# Patient Record
Sex: Male | Born: 1950 | Race: White | Hispanic: No | Marital: Married | Smoking: Never smoker
Health system: Southern US, Community
[De-identification: ages and names within clinical notes are randomized; demographics above are authoritative.]

## PROBLEM LIST (undated history)

## (undated) DIAGNOSIS — T8859XA Other complications of anesthesia, initial encounter: Secondary | ICD-10-CM

## (undated) DIAGNOSIS — I2510 Atherosclerotic heart disease of native coronary artery without angina pectoris: Secondary | ICD-10-CM

## (undated) DIAGNOSIS — T4145XA Adverse effect of unspecified anesthetic, initial encounter: Secondary | ICD-10-CM

## (undated) DIAGNOSIS — C801 Malignant (primary) neoplasm, unspecified: Secondary | ICD-10-CM

## (undated) DIAGNOSIS — M1990 Unspecified osteoarthritis, unspecified site: Secondary | ICD-10-CM

## (undated) DIAGNOSIS — I499 Cardiac arrhythmia, unspecified: Secondary | ICD-10-CM

## (undated) DIAGNOSIS — I10 Essential (primary) hypertension: Secondary | ICD-10-CM

## (undated) DIAGNOSIS — Z9889 Other specified postprocedural states: Secondary | ICD-10-CM

## (undated) DIAGNOSIS — R112 Nausea with vomiting, unspecified: Secondary | ICD-10-CM

## (undated) DIAGNOSIS — M1712 Unilateral primary osteoarthritis, left knee: Secondary | ICD-10-CM

## (undated) DIAGNOSIS — G4730 Sleep apnea, unspecified: Secondary | ICD-10-CM

## (undated) HISTORY — PX: CARDIAC ELECTROPHYSIOLOGY MAPPING AND ABLATION: SHX1292

## (undated) HISTORY — PX: BACK SURGERY: SHX140

## (undated) HISTORY — PX: ANTERIOR CERVICAL DECOMP/DISCECTOMY FUSION: SHX1161

## (undated) HISTORY — PX: ROTATOR CUFF REPAIR: SHX139

---

## 1995-05-23 HISTORY — PX: KNEE ARTHROSCOPY W/ OATS PROCEDURE: SHX1880

## 2016-05-22 HISTORY — PX: KNEE ARTHROSCOPY: SUR90

## 2017-12-27 NOTE — Pre-Procedure Instructions (Signed)
[facility]  12/27/2017      [id number] [place], [place] - [address] AT [facility] [address] [place] [address] Phone: [phone] Fax: [phone]    Your procedure is scheduled on January 08, 2018.  Report to [facility] Admitting at 815 AM.  Call this number if you have problems the morning of surgery:  [phone]   Remember:  Do not eat or drink after midnight.    Take these medicines the morning of surgery with A SIP OF WATER  Tylenol-if needed Omeprazole (prilosec)   Follow your surgeon's instructions on when to hold/resume aspirin.  If no instructions were given call the office to determine how they would like to you take aspirin  7 days prior to surgery STOP taking any Aleve, Naproxen, Ibuprofen, Motrin, Advil, Goody's, BC's, all herbal medications, fish oil, and all vitamins    Do not wear jewelry  Do not wear lotions, powders, or colognes, or deodorant.  Men may shave face and neck.  Do not bring valuables to the hospital.  [facility] is not responsible for any belongings or valuables.  Contacts, dentures or bridgework may not be worn into surgery.  Leave your suitcase in the car.  After surgery it may be brought to your room.  For patients admitted to the hospital, discharge time will be determined by your treatment team.  Patients discharged the day of surgery will not be allowed to drive home.    [facility]- Preparing For Surgery  Before surgery, you can play an important role. Because skin is not sterile, your skin needs to be as free of germs as possible. You can reduce the number of germs on your skin by washing with CHG (chlorahexidine gluconate) Soap before surgery.  CHG is an antiseptic cleaner which kills germs and bonds with the skin to continue killing germs even after washing.    Oral Hygiene is also important to reduce your risk of infection.  Remember - BRUSH YOUR TEETH THE  MORNING OF SURGERY WITH YOUR REGULAR TOOTHPASTE  Please do not use if you have an allergy to CHG or antibacterial soaps. If your skin becomes reddened/irritated stop using the CHG.  Do not shave (including legs and underarms) for at least 48 hours prior to first CHG shower. It is OK to shave your face.  Please follow these instructions carefully.   1. Shower the NIGHT BEFORE SURGERY and the MORNING OF SURGERY with CHG.   2. If you chose to wash your hair, wash your hair first as usual with your normal shampoo.  3. After you shampoo, rinse your hair and body thoroughly to remove the shampoo.  4. Use CHG as you would any other liquid soap. You can apply CHG directly to the skin and wash gently with a scrungie or a clean washcloth.   5. Apply the CHG Soap to your body ONLY FROM THE NECK DOWN.  Do not use on open wounds or open sores. Avoid contact with your eyes, ears, mouth and genitals (private parts). Wash Face and genitals (private parts)  with your normal soap.  6. Wash thoroughly, paying special attention to the area where your surgery will be performed.  7. Thoroughly rinse your body with warm water from the neck down.  8. DO NOT shower/wash with your normal soap after using and rinsing off the CHG Soap.  9. Pat yourself dry with a CLEAN  TOWEL.  10. Wear CLEAN PAJAMAS to bed the night before surgery, wear comfortable clothes the morning of surgery  11. Place CLEAN SHEETS on your bed the night of your first shower and DO NOT SLEEP WITH PETS.  Day of Surgery:  Do not apply any deodorants/lotions.  Please wear clean clothes to the hospital/surgery center.   Remember to brush your teeth WITH YOUR REGULAR TOOTHPASTE.   Please read over the following fact sheets that you were given. Pain Booklet, Coughing and Deep Breathing, MRSA Information and Surgical Site Infection Prevention

## 2017-12-28 ENCOUNTER — Encounter (HOSPITAL_COMMUNITY)
Admission: RE | Admit: 2017-12-28 | Discharge: 2017-12-28 | Disposition: A | Discharge status: Home / Self Care | Payer: Medicare Other | Source: Ambulatory Visit | Attending: Orthopedic Surgery | Admitting: Orthopedic Surgery

## 2017-12-28 ENCOUNTER — Other Ambulatory Visit: Payer: Self-pay

## 2017-12-28 ENCOUNTER — Encounter (HOSPITAL_COMMUNITY): Payer: Self-pay

## 2017-12-28 DIAGNOSIS — Z01818 Encounter for other preprocedural examination: Secondary | ICD-10-CM | POA: Diagnosis present

## 2017-12-28 DIAGNOSIS — M1712 Unilateral primary osteoarthritis, left knee: Secondary | ICD-10-CM | POA: Diagnosis not present

## 2017-12-28 HISTORY — DX: Other complications of anesthesia, initial encounter: T88.59XA

## 2017-12-28 HISTORY — DX: Other specified postprocedural states: R11.2

## 2017-12-28 HISTORY — DX: Essential (primary) hypertension: I10

## 2017-12-28 HISTORY — DX: Adverse effect of unspecified anesthetic, initial encounter: T41.45XA

## 2017-12-28 HISTORY — DX: Sleep apnea, unspecified: G47.30

## 2017-12-28 HISTORY — DX: Atherosclerotic heart disease of native coronary artery without angina pectoris: I25.10

## 2017-12-28 HISTORY — DX: Unspecified osteoarthritis, unspecified site: M19.90

## 2017-12-28 HISTORY — DX: Nausea with vomiting, unspecified: Z98.890

## 2017-12-28 HISTORY — DX: Malignant (primary) neoplasm, unspecified: C80.1

## 2017-12-28 LAB — CBC
HCT: 44.9 % (ref 39.0–52.0)
Hemoglobin: 14.6 g/dL (ref 13.0–17.0)
MCH: 30.3 pg (ref 26.0–34.0)
MCHC: 32.5 g/dL (ref 30.0–36.0)
MCV: 93.2 fL (ref 78.0–100.0)
PLATELETS: 223 10*3/uL (ref 150–400)
RBC: 4.82 MIL/uL (ref 4.22–5.81)
RDW: 13.2 % (ref 11.5–15.5)
WBC: 7.3 10*3/uL (ref 4.0–10.5)

## 2017-12-28 LAB — BASIC METABOLIC PANEL
Anion gap: 8 (ref 5–15)
BUN: 14 mg/dL (ref 8–23)
CHLORIDE: 107 mmol/L (ref 98–111)
CO2: 26 mmol/L (ref 22–32)
CREATININE: 1.07 mg/dL (ref 0.61–1.24)
Calcium: 9.1 mg/dL (ref 8.9–10.3)
GFR calc Af Amer: >60 mL/min (ref >60)
Glucose, Bld: 113 mg/dL — ABNORMAL HIGH (ref 70–99)
POTASSIUM: 4.5 mmol/L (ref 3.5–5.1)
SODIUM: 141 mmol/L (ref 135–145)

## 2017-12-28 LAB — SURGICAL PCR SCREEN
MRSA, PCR: NEGATIVE
STAPHYLOCOCCUS AUREUS: NEGATIVE

## 2017-12-28 NOTE — Progress Notes (Signed)
PCP: Laural Benes, MD  Cardiologist: Gilford Rile, MD-gave cardiac clearance  EKG: requested from Dr. Gilford Rile-  Stress test: pt denies  ECHO: pt denies  Cardiac Cath: pt denies  Chest x-ray: pt denies past year, no recent respiratory infections/complications  Pt on Aspirin 81 mg- advised to continue per surgeon

## 2018-01-01 NOTE — Progress Notes (Signed)
Anesthesia Chart Review:  Case:  [id number] Date/Time:  01/08/18 1000   Procedure:  LEFT UNICOMPARTMENTAL KNEE (Left Knee)   Anesthesia type:  Choice   Pre-op diagnosis:  DJD LEFT KNEE   Location:  [facility] OR ROOM 04 / [place] OR   Surgeon:  Marchia Bond, MD      DISCUSSION: 67 yo never smoker for above procedure. Pertinent hx includes PONV, CAD, OSA, HTN, Arthritis. S/p afb/flutter ablation on 09/07/2010 and again on 05/21/2011 now maintaining sinus rhythm.  Pt has cardiac clearance from Dr. Gilford Rile dated 12/28/2017. In his clearance a summary of the pt's cardiac testing history was provided and is as follows: "Echo 09/25/2005 revealed EF 60-65%, Holter 09/25/2005 revealed normal sinus rhythm with rare PVCs, no correlation. ETT 10/24/2005 revealed negative and adequate.  Stress Cardiolite 10/13/2010 revealed mild inferior, inferolateral ischemia, no scar, EF 51%.  Cardiac cath on 03/14/2011 revealed EF 60%, LM = normal, LAD = normal, LCx = normal, PDA = 25 to 50% proximal, RCA = normal and was treated medically.  He continues to do well clinically and functionally.  EKG 12/14/2017 benign with a normal sinus rhythm and old AMI (likely lead placement versus prior ECG).Marland KitchenMarland KitchenRequires left partial knee replacement by Dr. Mardelle Matte in [place] on January 08, 2018.  In view of the patient's stable clinical and functional status and benign evaluation, the patient is cleared for surgery from cardiac standpoint."  Anticipate he can proceed with surgery as planned barring acute status change  VS: BP (!) 152/86   Pulse 65   Temp 36.9 C   Resp 20   Ht 6\' 2"  (1.88 m)   Wt 119.5 kg   SpO2 98%   BMI 33.82 kg/m   PROVIDERS: Laural Benes, MD is PCP  Gilford Rile, MD is Cardiologist last seen 12/28/2017   LABS: Labs reviewed: Acceptable for surgery. (all labs ordered are listed, but only abnormal results are displayed)  Labs Reviewed  BASIC METABOLIC PANEL - Abnormal; Notable for the following components:      Result  Value   Glucose, Bld 113 (*)    All other components within normal limits  SURGICAL PCR SCREEN  CBC     IMAGES: N/A   EKG: 12/14/2017 ([facility] record, in pt chart): sinus rhythm, consider old anterior infarct  CV: See above for summary of cardiac testing  Past Medical History:  Diagnosis Date  . Arthritis   . Cancer (HCC)    Squamous and basal cell skin cancer  . Complication of anesthesia   . Coronary artery disease   . Hypertension   . PONV (postoperative nausea and vomiting)    1997  . Sleep apnea     Past Surgical History:  Procedure Laterality Date  . BACK SURGERY     ACDF, lumbar laminectomy 2010  . CARDIAC ELECTROPHYSIOLOGY [place] AND ABLATION     2013  . KNEE ARTHROSCOPY Left 2018  . KNEE ARTHROSCOPY W/ OATS PROCEDURE Left 1997  . ROTATOR CUFF REPAIR Right     MEDICATIONS: . acetaminophen (TYLENOL) 500 MG tablet  . aspirin EC 81 MG tablet  . bisoprolol-hydrochlorothiazide (ZIAC) 10-6.25 MG tablet  . ibuprofen (ADVIL,MOTRIN) 200 MG tablet  . losartan (COZAAR) 100 MG tablet  . omeprazole (PRILOSEC OTC) 20 MG tablet  . rosuvastatin (CRESTOR) 5 MG tablet   No current facility-administered medications for this encounter.     Wynonia Musty [facility] Short Stay Center/Anesthesiology Phone [phone] 01/01/2018 1:42 PM

## 2018-01-07 MED ORDER — TRANEXAMIC ACID 1000 MG/10ML IV SOLN
1000.0000 mg | INTRAVENOUS | Status: AC
  Filled 2018-01-07: qty 10

## 2018-01-07 MED ORDER — DEXTROSE 5 % IV SOLN
3.0000 g | INTRAVENOUS | Status: AC
  Filled 2018-01-07: qty 3000

## 2018-01-15 ENCOUNTER — Encounter (HOSPITAL_COMMUNITY): Payer: Self-pay | Admitting: Orthopedic Surgery

## 2018-01-15 ENCOUNTER — Observation Stay (HOSPITAL_COMMUNITY): Payer: Medicare Other

## 2018-01-15 ENCOUNTER — Ambulatory Visit (HOSPITAL_COMMUNITY): Payer: Medicare Other | Admitting: Certified Registered Nurse Anesthetist

## 2018-01-15 ENCOUNTER — Observation Stay (HOSPITAL_COMMUNITY)
Admission: RE | Admit: 2018-01-15 | Discharge: 2018-01-16 | Disposition: A | Discharge status: Home / Self Care | Payer: Medicare Other | Source: Ambulatory Visit | Attending: Orthopedic Surgery | Admitting: Orthopedic Surgery

## 2018-01-15 ENCOUNTER — Ambulatory Visit (HOSPITAL_COMMUNITY): Payer: Medicare Other | Admitting: Physician Assistant

## 2018-01-15 ENCOUNTER — Encounter (HOSPITAL_COMMUNITY)
Admission: RE | Disposition: A | Discharge status: Home / Self Care | Payer: Self-pay | Source: Ambulatory Visit | Attending: Orthopedic Surgery

## 2018-01-15 ENCOUNTER — Other Ambulatory Visit: Payer: Self-pay

## 2018-01-15 DIAGNOSIS — M1712 Unilateral primary osteoarthritis, left knee: Secondary | ICD-10-CM | POA: Diagnosis present

## 2018-01-15 DIAGNOSIS — G4730 Sleep apnea, unspecified: Secondary | ICD-10-CM | POA: Diagnosis not present

## 2018-01-15 DIAGNOSIS — Z79899 Other long term (current) drug therapy: Secondary | ICD-10-CM | POA: Diagnosis not present

## 2018-01-15 DIAGNOSIS — I2510 Atherosclerotic heart disease of native coronary artery without angina pectoris: Secondary | ICD-10-CM | POA: Diagnosis not present

## 2018-01-15 DIAGNOSIS — Z96652 Presence of left artificial knee joint: Secondary | ICD-10-CM

## 2018-01-15 DIAGNOSIS — Z85828 Personal history of other malignant neoplasm of skin: Secondary | ICD-10-CM | POA: Diagnosis not present

## 2018-01-15 DIAGNOSIS — I10 Essential (primary) hypertension: Secondary | ICD-10-CM | POA: Diagnosis not present

## 2018-01-15 DIAGNOSIS — Z888 Allergy status to other drugs, medicaments and biological substances status: Secondary | ICD-10-CM | POA: Diagnosis not present

## 2018-01-15 HISTORY — DX: Unilateral primary osteoarthritis, left knee: M17.12

## 2018-01-15 HISTORY — PX: PARTIAL KNEE ARTHROPLASTY: SHX2174

## 2018-01-15 SURGERY — ARTHROPLASTY, KNEE, UNICOMPARTMENTAL
Anesthesia: Spinal | Site: Knee | Laterality: Left

## 2018-01-15 MED ORDER — GABAPENTIN 300 MG PO CAPS
300.0000 mg | ORAL_CAPSULE | Freq: Once | ORAL | Status: AC
  Administered 2018-01-15: 300 mg via ORAL
  Filled 2018-01-15: qty 1

## 2018-01-15 MED ORDER — KETOROLAC TROMETHAMINE 30 MG/ML IJ SOLN
INTRAMUSCULAR | Status: DC | PRN
  Administered 2018-01-15: 30 mg

## 2018-01-15 MED ORDER — ONDANSETRON HCL 4 MG/2ML IJ SOLN: 4.0000 mg | Freq: Four times a day (QID) | INTRAMUSCULAR | Status: DC | PRN

## 2018-01-15 MED ORDER — BACLOFEN 10 MG PO TABS: 10.0000 mg | ORAL_TABLET | Freq: Three times a day (TID) | ORAL | 0 refills | Status: DC

## 2018-01-15 MED ORDER — OXYCODONE HCL 5 MG PO TABS
10.0000 mg | ORAL_TABLET | ORAL | Status: DC | PRN
  Filled 2018-01-15: qty 2

## 2018-01-15 MED ORDER — TRANEXAMIC ACID 1000 MG/10ML IV SOLN
1000.0000 mg | INTRAVENOUS | Status: AC
  Administered 2018-01-15: 1000 mg via INTRAVENOUS
  Filled 2018-01-15: qty 1000

## 2018-01-15 MED ORDER — ASPIRIN EC 325 MG PO TBEC: 325.0000 mg | DELAYED_RELEASE_TABLET | Freq: Every day | ORAL | 0 refills | Status: DC

## 2018-01-15 MED ORDER — MAGNESIUM CITRATE PO SOLN: 1.0000 | Freq: Once | ORAL | Status: DC | PRN

## 2018-01-15 MED ORDER — LACTATED RINGERS IV SOLN
INTRAVENOUS | Status: DC
  Administered 2018-01-15: 11:00:00 via INTRAVENOUS

## 2018-01-15 MED ORDER — ONDANSETRON HCL 4 MG/2ML IJ SOLN
INTRAMUSCULAR | Status: DC | PRN
  Administered 2018-01-15: 4 mg via INTRAVENOUS

## 2018-01-15 MED ORDER — MIDAZOLAM HCL 5 MG/5ML IJ SOLN
INTRAMUSCULAR | Status: DC | PRN
  Administered 2018-01-15: 1 mg via INTRAVENOUS

## 2018-01-15 MED ORDER — MEPERIDINE HCL 50 MG/ML IJ SOLN: 6.2500 mg | INTRAMUSCULAR | Status: DC | PRN

## 2018-01-15 MED ORDER — KETOROLAC TROMETHAMINE 30 MG/ML IJ SOLN
INTRAMUSCULAR | Status: AC
  Filled 2018-01-15: qty 1

## 2018-01-15 MED ORDER — DEXAMETHASONE SODIUM PHOSPHATE 10 MG/ML IJ SOLN
8.0000 mg | Freq: Once | INTRAMUSCULAR | Status: AC
  Administered 2018-01-15: 8 mg via INTRAVENOUS
  Filled 2018-01-15: qty 1

## 2018-01-15 MED ORDER — OXYCODONE HCL 5 MG PO TABS: 5.0000 mg | ORAL_TABLET | ORAL | 0 refills | Status: DC | PRN

## 2018-01-15 MED ORDER — PROPOFOL 500 MG/50ML IV EMUL
INTRAVENOUS | Status: DC | PRN
  Administered 2018-01-15: 10 ug/kg/min via INTRAVENOUS

## 2018-01-15 MED ORDER — HYDROMORPHONE HCL 1 MG/ML IJ SOLN: 0.5000 mg | INTRAMUSCULAR | Status: DC | PRN

## 2018-01-15 MED ORDER — FENTANYL CITRATE (PF) 100 MCG/2ML IJ SOLN
100.0000 ug | Freq: Once | INTRAMUSCULAR | Status: AC
  Administered 2018-01-15: 100 ug via INTRAVENOUS

## 2018-01-15 MED ORDER — METHOCARBAMOL 500 MG PO TABS
ORAL_TABLET | ORAL | Status: AC
  Filled 2018-01-15: qty 1

## 2018-01-15 MED ORDER — POLYETHYLENE GLYCOL 3350 17 G PO PACK: 17.0000 g | PACK | Freq: Every day | ORAL | Status: DC | PRN

## 2018-01-15 MED ORDER — CEFAZOLIN SODIUM-DEXTROSE 2-4 GM/100ML-% IV SOLN
2.0000 g | INTRAVENOUS | Status: AC
  Administered 2018-01-15: 2 g via INTRAVENOUS
  Filled 2018-01-15: qty 100

## 2018-01-15 MED ORDER — CEFAZOLIN SODIUM-DEXTROSE 2-4 GM/100ML-% IV SOLN
INTRAVENOUS | Status: AC
  Filled 2018-01-15: qty 100

## 2018-01-15 MED ORDER — HYDROMORPHONE HCL 1 MG/ML IJ SOLN: 0.2500 mg | INTRAMUSCULAR | Status: DC | PRN

## 2018-01-15 MED ORDER — CEFAZOLIN SODIUM-DEXTROSE 2-4 GM/100ML-% IV SOLN
2.0000 g | Freq: Four times a day (QID) | INTRAVENOUS | Status: AC
  Administered 2018-01-15 – 2018-01-16 (×2): 2 g via INTRAVENOUS
  Filled 2018-01-15 (×2): qty 100

## 2018-01-15 MED ORDER — BISOPROLOL-HYDROCHLOROTHIAZIDE 10-6.25 MG PO TABS
0.5000 | ORAL_TABLET | Freq: Every day | ORAL | Status: DC
  Administered 2018-01-15 – 2018-01-16 (×2): 0.5 via ORAL
  Filled 2018-01-15 (×2): qty 1

## 2018-01-15 MED ORDER — PROPOFOL 10 MG/ML IV BOLUS
INTRAVENOUS | Status: AC
  Filled 2018-01-15: qty 20

## 2018-01-15 MED ORDER — OMEPRAZOLE MAGNESIUM 20 MG PO TBEC: 20.0000 mg | DELAYED_RELEASE_TABLET | Freq: Every day | ORAL | Status: DC | PRN

## 2018-01-15 MED ORDER — DOCUSATE SODIUM 100 MG PO CAPS
100.0000 mg | ORAL_CAPSULE | Freq: Two times a day (BID) | ORAL | Status: DC
  Administered 2018-01-15 – 2018-01-16 (×2): 100 mg via ORAL
  Filled 2018-01-15 (×2): qty 1

## 2018-01-15 MED ORDER — ASPIRIN EC 325 MG PO TBEC
325.0000 mg | DELAYED_RELEASE_TABLET | Freq: Two times a day (BID) | ORAL | Status: DC
  Administered 2018-01-15 – 2018-01-16 (×2): 325 mg via ORAL
  Filled 2018-01-15 (×2): qty 1

## 2018-01-15 MED ORDER — LIDOCAINE 2% (20 MG/ML) 5 ML SYRINGE
INTRAMUSCULAR | Status: DC | PRN
  Administered 2018-01-15: 100 mg via INTRAVENOUS

## 2018-01-15 MED ORDER — MIDAZOLAM HCL 2 MG/2ML IJ SOLN
INTRAMUSCULAR | Status: AC
  Filled 2018-01-15: qty 2

## 2018-01-15 MED ORDER — ACETAMINOPHEN 325 MG PO TABS: 325.0000 mg | ORAL_TABLET | Freq: Four times a day (QID) | ORAL | Status: DC | PRN

## 2018-01-15 MED ORDER — SODIUM CHLORIDE 0.9 % IV SOLN
INTRAVENOUS | Status: DC | PRN
  Administered 2018-01-15: 25 ug/min via INTRAVENOUS

## 2018-01-15 MED ORDER — DIPHENHYDRAMINE HCL 12.5 MG/5ML PO ELIX: 12.5000 mg | ORAL_SOLUTION | ORAL | Status: DC | PRN

## 2018-01-15 MED ORDER — MENTHOL 3 MG MT LOZG: 1.0000 | LOZENGE | OROMUCOSAL | Status: DC | PRN

## 2018-01-15 MED ORDER — ROPIVACAINE HCL 7.5 MG/ML IJ SOLN
INTRAMUSCULAR | Status: DC | PRN
  Administered 2018-01-15 (×6): 5 mL via PERINEURAL

## 2018-01-15 MED ORDER — PROMETHAZINE HCL 25 MG/ML IJ SOLN: 6.2500 mg | INTRAMUSCULAR | Status: DC | PRN

## 2018-01-15 MED ORDER — METHOCARBAMOL 1000 MG/10ML IJ SOLN: 500.0000 mg | Freq: Four times a day (QID) | INTRAVENOUS | Status: DC | PRN

## 2018-01-15 MED ORDER — PANTOPRAZOLE SODIUM 40 MG PO TBEC: 40.0000 mg | DELAYED_RELEASE_TABLET | Freq: Every day | ORAL | Status: DC | PRN

## 2018-01-15 MED ORDER — ONDANSETRON HCL 4 MG PO TABS: 4.0000 mg | ORAL_TABLET | Freq: Four times a day (QID) | ORAL | Status: DC | PRN

## 2018-01-15 MED ORDER — POTASSIUM CHLORIDE IN NACL 20-0.45 MEQ/L-% IV SOLN
INTRAVENOUS | Status: DC
  Administered 2018-01-15 – 2018-01-16 (×2): via INTRAVENOUS
  Filled 2018-01-15 (×2): qty 1000

## 2018-01-15 MED ORDER — 0.9 % SODIUM CHLORIDE (POUR BTL) OPTIME
TOPICAL | Status: DC | PRN
  Administered 2018-01-15: 1000 mL

## 2018-01-15 MED ORDER — FENTANYL CITRATE (PF) 100 MCG/2ML IJ SOLN
INTRAMUSCULAR | Status: DC | PRN
  Administered 2018-01-15: 50 ug via INTRAVENOUS
  Administered 2018-01-15 (×3): 25 ug via INTRAVENOUS
  Administered 2018-01-15: 50 ug via INTRAVENOUS

## 2018-01-15 MED ORDER — MIDAZOLAM HCL 2 MG/2ML IJ SOLN
INTRAMUSCULAR | Status: AC
  Administered 2018-01-15: 2 mg via INTRAVENOUS
  Filled 2018-01-15: qty 2

## 2018-01-15 MED ORDER — ALUM & MAG HYDROXIDE-SIMETH 200-200-20 MG/5ML PO SUSP: 30.0000 mL | ORAL | Status: DC | PRN

## 2018-01-15 MED ORDER — OXYCODONE HCL 5 MG PO TABS
ORAL_TABLET | ORAL | Status: AC
  Filled 2018-01-15: qty 1

## 2018-01-15 MED ORDER — MIDAZOLAM HCL 2 MG/2ML IJ SOLN
2.0000 mg | Freq: Once | INTRAMUSCULAR | Status: AC
  Administered 2018-01-15: 2 mg via INTRAVENOUS

## 2018-01-15 MED ORDER — ACETAMINOPHEN 500 MG PO TABS
1000.0000 mg | ORAL_TABLET | Freq: Once | ORAL | Status: AC
  Administered 2018-01-15: 1000 mg via ORAL
  Filled 2018-01-15: qty 2

## 2018-01-15 MED ORDER — LOSARTAN POTASSIUM 50 MG PO TABS
100.0000 mg | ORAL_TABLET | Freq: Every day | ORAL | Status: DC
  Administered 2018-01-16: 100 mg via ORAL
  Filled 2018-01-15: qty 2

## 2018-01-15 MED ORDER — SENNA-DOCUSATE SODIUM 8.6-50 MG PO TABS: 2.0000 | ORAL_TABLET | Freq: Every day | ORAL | 1 refills | Status: DC

## 2018-01-15 MED ORDER — DEXAMETHASONE SODIUM PHOSPHATE 10 MG/ML IJ SOLN
10.0000 mg | Freq: Once | INTRAMUSCULAR | Status: AC
  Administered 2018-01-16: 10 mg via INTRAVENOUS
  Filled 2018-01-15: qty 1

## 2018-01-15 MED ORDER — ZOLPIDEM TARTRATE 5 MG PO TABS: 5.0000 mg | ORAL_TABLET | Freq: Every evening | ORAL | Status: DC | PRN

## 2018-01-15 MED ORDER — KETOROLAC TROMETHAMINE 15 MG/ML IJ SOLN
15.0000 mg | Freq: Four times a day (QID) | INTRAMUSCULAR | Status: DC
  Administered 2018-01-15 – 2018-01-16 (×3): 15 mg via INTRAVENOUS
  Filled 2018-01-15 (×3): qty 1

## 2018-01-15 MED ORDER — SODIUM CHLORIDE 0.9 % IR SOLN
Status: DC | PRN
  Administered 2018-01-15: 1000 mL

## 2018-01-15 MED ORDER — EPHEDRINE 5 MG/ML INJ
INTRAVENOUS | Status: AC
  Filled 2018-01-15: qty 10

## 2018-01-15 MED ORDER — FENTANYL CITRATE (PF) 100 MCG/2ML IJ SOLN
INTRAMUSCULAR | Status: AC
  Administered 2018-01-15: 100 ug via INTRAVENOUS
  Filled 2018-01-15: qty 2

## 2018-01-15 MED ORDER — METOCLOPRAMIDE HCL 5 MG PO TABS: 5.0000 mg | ORAL_TABLET | Freq: Three times a day (TID) | ORAL | Status: DC | PRN

## 2018-01-15 MED ORDER — BISACODYL 10 MG RE SUPP: 10.0000 mg | Freq: Every day | RECTAL | Status: DC | PRN

## 2018-01-15 MED ORDER — PHENOL 1.4 % MT LIQD: 1.0000 | OROMUCOSAL | Status: DC | PRN

## 2018-01-15 MED ORDER — ROSUVASTATIN CALCIUM 5 MG PO TABS
5.0000 mg | ORAL_TABLET | Freq: Every day | ORAL | Status: DC
  Administered 2018-01-15: 5 mg via ORAL
  Filled 2018-01-15: qty 1

## 2018-01-15 MED ORDER — METHOCARBAMOL 500 MG PO TABS
500.0000 mg | ORAL_TABLET | Freq: Four times a day (QID) | ORAL | Status: DC | PRN
  Administered 2018-01-15: 500 mg via ORAL

## 2018-01-15 MED ORDER — KETOROLAC TROMETHAMINE 30 MG/ML IJ SOLN: 30.0000 mg | Freq: Once | INTRAMUSCULAR | Status: DC | PRN

## 2018-01-15 MED ORDER — BUPIVACAINE HCL (PF) 0.25 % IJ SOLN
INTRAMUSCULAR | Status: AC
  Filled 2018-01-15: qty 30

## 2018-01-15 MED ORDER — ACETAMINOPHEN 500 MG PO TABS
1000.0000 mg | ORAL_TABLET | Freq: Four times a day (QID) | ORAL | Status: DC
  Administered 2018-01-15 – 2018-01-16 (×3): 1000 mg via ORAL
  Filled 2018-01-15 (×3): qty 2

## 2018-01-15 MED ORDER — BUPIVACAINE HCL (PF) 0.25 % IJ SOLN
INTRAMUSCULAR | Status: DC | PRN
  Administered 2018-01-15: 28 mL

## 2018-01-15 MED ORDER — LIDOCAINE 2% (20 MG/ML) 5 ML SYRINGE
INTRAMUSCULAR | Status: AC
  Filled 2018-01-15: qty 5

## 2018-01-15 MED ORDER — ONDANSETRON HCL 4 MG/2ML IJ SOLN
INTRAMUSCULAR | Status: AC
  Filled 2018-01-15: qty 2

## 2018-01-15 MED ORDER — ONDANSETRON HCL 4 MG PO TABS: 4.0000 mg | ORAL_TABLET | Freq: Three times a day (TID) | ORAL | 0 refills | Status: DC | PRN

## 2018-01-15 MED ORDER — FENTANYL CITRATE (PF) 250 MCG/5ML IJ SOLN
INTRAMUSCULAR | Status: AC
  Filled 2018-01-15: qty 5

## 2018-01-15 MED ORDER — OXYCODONE HCL 5 MG PO TABS
5.0000 mg | ORAL_TABLET | ORAL | Status: DC | PRN
  Administered 2018-01-15: 5 mg via ORAL
  Administered 2018-01-15 – 2018-01-16 (×3): 10 mg via ORAL
  Filled 2018-01-15 (×2): qty 2

## 2018-01-15 MED ORDER — METOCLOPRAMIDE HCL 5 MG/ML IJ SOLN: 5.0000 mg | Freq: Three times a day (TID) | INTRAMUSCULAR | Status: DC | PRN

## 2018-01-15 MED ORDER — EPHEDRINE SULFATE-NACL 50-0.9 MG/10ML-% IV SOSY
PREFILLED_SYRINGE | INTRAVENOUS | Status: DC | PRN
  Administered 2018-01-15 (×2): 5 mg via INTRAVENOUS

## 2018-01-15 MED ORDER — PROPOFOL 10 MG/ML IV BOLUS
INTRAVENOUS | Status: DC | PRN
  Administered 2018-01-15: 40 mg via INTRAVENOUS
  Administered 2018-01-15: 160 mg via INTRAVENOUS

## 2018-01-15 SURGICAL SUPPLY — 53 items
BANDAGE ESMARK 6X9 LF (GAUZE/BANDAGES/DRESSINGS) ×1 IMPLANT
BEARING TIBIAL STRL SZ3 LG (Knees) ×3 IMPLANT
BNDG ELASTIC 6X10 VLCR STRL LF (GAUZE/BANDAGES/DRESSINGS) ×3 IMPLANT
BNDG ESMARK 6X9 LF (GAUZE/BANDAGES/DRESSINGS) ×3
BOWL SMART MIX CTS (DISPOSABLE) ×3 IMPLANT
CEMENT BONE R 1X40 (Cement) ×3 IMPLANT
CLOSURE STERI-STRIP 1/2X4 (GAUZE/BANDAGES/DRESSINGS) ×1
CLOSURE WOUND 1/2 X4 (GAUZE/BANDAGES/DRESSINGS) ×1
CLSR STERI-STRIP ANTIMIC 1/2X4 (GAUZE/BANDAGES/DRESSINGS) ×2 IMPLANT
COMPONENT TIBIA MEDL OXFRD LFT (Joint) ×1 IMPLANT
COVER SURGICAL LIGHT HANDLE (MISCELLANEOUS) ×3 IMPLANT
CUFF TOURNIQUET SINGLE 34IN LL (TOURNIQUET CUFF) ×3 IMPLANT
DRAPE EXTREMITY T 121X128X90 (DRAPE) ×3 IMPLANT
DRAPE HALF SHEET 40X57 (DRAPES) ×3 IMPLANT
DRAPE U-SHAPE 47X51 STRL (DRAPES) ×3 IMPLANT
DRSG MEPILEX BORDER 4X8 (GAUZE/BANDAGES/DRESSINGS) ×3 IMPLANT
DURAPREP 26ML APPLICATOR (WOUND CARE) ×3 IMPLANT
ELECT CAUTERY BLADE 6.4 (BLADE) ×3 IMPLANT
ELECT REM PT RETURN 9FT ADLT (ELECTROSURGICAL) ×3
ELECTRODE REM PT RTRN 9FT ADLT (ELECTROSURGICAL) ×1 IMPLANT
GLOVE BIOGEL PI ORTHO PRO SZ8 (GLOVE) ×4
GLOVE ORTHO TXT STRL SZ7.5 (GLOVE) ×3 IMPLANT
GLOVE PI ORTHO PRO STRL SZ8 (GLOVE) ×2 IMPLANT
GLOVE SURG ORTHO 8.0 STRL STRW (GLOVE) ×3 IMPLANT
GOWN STRL REUS W/ TWL XL LVL3 (GOWN DISPOSABLE) ×1 IMPLANT
GOWN STRL REUS W/TWL 2XL LVL3 (GOWN DISPOSABLE) ×3 IMPLANT
GOWN STRL REUS W/TWL XL LVL3 (GOWN DISPOSABLE) ×2
HANDPIECE INTERPULSE COAX TIP (DISPOSABLE) ×2
HOOD PEEL AWAY FACE SHEILD DIS (HOOD) ×3 IMPLANT
HOOD PEEL AWAY FLYTE STAYCOOL (MISCELLANEOUS) ×3 IMPLANT
IMMOBILIZER KNEE 22 (SOFTGOODS) ×3 IMPLANT
IMMOBILIZER KNEE 22 UNIV (SOFTGOODS) ×3 IMPLANT
KIT BASIN OR (CUSTOM PROCEDURE TRAY) ×3 IMPLANT
KIT TURNOVER KIT B (KITS) ×3 IMPLANT
MANIFOLD NEPTUNE II (INSTRUMENTS) ×3 IMPLANT
NEEDLE HYPO 21X1.5 SAFETY (NEEDLE) IMPLANT
NS IRRIG 1000ML POUR BTL (IV SOLUTION) ×3 IMPLANT
PACK BLADE SAW RECIP 70 3 PT (BLADE) ×3 IMPLANT
PACK TOTAL JOINT (CUSTOM PROCEDURE TRAY) ×3 IMPLANT
PAD ARMBOARD 7.5X6 YLW CONV (MISCELLANEOUS) ×3 IMPLANT
PEG FEMORAL CEMENT STRL LRG (Knees) ×3 IMPLANT
SET HNDPC FAN SPRY TIP SCT (DISPOSABLE) ×1 IMPLANT
STRIP CLOSURE SKIN 1/2X4 (GAUZE/BANDAGES/DRESSINGS) ×2 IMPLANT
SUCTION FRAZIER HANDLE 10FR (MISCELLANEOUS) ×2
SUCTION TUBE FRAZIER 10FR DISP (MISCELLANEOUS) ×1 IMPLANT
SUT VIC AB 0 CT1 27 (SUTURE) ×2
SUT VIC AB 0 CT1 27XBRD ANBCTR (SUTURE) ×1 IMPLANT
SUT VIC AB 1 CT1 27 (SUTURE) ×2
SUT VIC AB 1 CT1 27XBRD ANBCTR (SUTURE) ×1 IMPLANT
SUT VIC AB 3-0 SH 8-18 (SUTURE) ×3 IMPLANT
SYR CONTROL 10ML LL (SYRINGE) ×3 IMPLANT
TIBIA MEDIAL OXFORD LEFT (Joint) ×3 IMPLANT
TOWEL OR 17X26 10 PK STRL BLUE (TOWEL DISPOSABLE) ×3 IMPLANT

## 2018-01-15 NOTE — H&P (Signed)
PREOPERATIVE H&P  Chief Complaint: left knee pain  HPI: Terrence Gross is a 67 y.o. male who presents for preoperative history and physical with a diagnosis of left knee primary localized osteoarthritis. Symptoms are rated as moderate to severe, and have been worsening.  This is significantly impairing activities of daily living.  He has elected for surgical management. He has failed OATS and microfracture.  He has failed injections, activity modification, anti-inflammatories, and assistive devices.  Preoperative X-rays demonstrate end stage degenerative changes with osteophyte formation, loss of joint space, subchondral sclerosis.   Past Medical History:  Diagnosis Date  . Arthritis   . Cancer (HCC)    Squamous and basal cell skin cancer  . Complication of anesthesia   . Coronary artery disease   . Hypertension   . PONV (postoperative nausea and vomiting)    1997  . Sleep apnea    Past Surgical History:  Procedure Laterality Date  . BACK SURGERY     ACDF, lumbar laminectomy 2010  . CARDIAC ELECTROPHYSIOLOGY [place] AND ABLATION     2013  . KNEE ARTHROSCOPY Left 2018  . KNEE ARTHROSCOPY W/ OATS PROCEDURE Left 1997  . ROTATOR CUFF REPAIR Right    Social History   Socioeconomic History  . Marital status: Married    Spouse name: Not on file  . Number of children: Not on file  . Years of education: Not on file  . Highest education level: Not on file  Occupational History  . Not on file  Social Needs  . Financial resource strain: Not on file  . Food insecurity:    Worry: Not on file    Inability: Not on file  . Transportation needs:    Medical: Not on file    Non-medical: Not on file  Tobacco Use  . Smoking status: Never Smoker  . Smokeless tobacco: Never Used  Substance and Sexual Activity  . Alcohol use: Yes    Comment: 1--2 drinks a day  . Drug use: Not Currently  . Sexual activity: Not on file  Lifestyle  . Physical activity:    Days per week: Not on  file    Minutes per session: Not on file  . Stress: Not on file  Relationships  . Social connections:    Talks on phone: Not on file    Gets together: Not on file    Attends religious service: Not on file    Active member of club or organization: Not on file    Attends meetings of clubs or organizations: Not on file    Relationship status: Not on file  Other Topics Concern  . Not on file  Social History Narrative  . Not on file   History reviewed. No pertinent family history. Allergies  Allergen Reactions  . Tape Rash and Other (See Comments)    BLISTERS [PAPER TAPE OK TO USE]  . Other Rash    SILK TAPE   Prior to Admission medications   Medication Sig Start Date End Date Taking? Authorizing Provider  acetaminophen (TYLENOL) 500 MG tablet Take 1,000 mg by mouth daily as needed for moderate pain or headache.   Yes [provider]  aspirin EC 81 MG tablet Take 81 mg by mouth at bedtime.   Yes [provider]  bisoprolol-hydrochlorothiazide (ZIAC) 10-6.25 MG tablet Take 0.5 tablets by mouth daily.   Yes [provider]  ibuprofen (ADVIL,MOTRIN) 200 MG tablet Take 800 mg by mouth daily as needed for moderate pain.  Yes [provider]  losartan (COZAAR) 100 MG tablet Take 100 mg by mouth daily.   Yes [provider]  omeprazole (PRILOSEC OTC) 20 MG tablet Take 20 mg by mouth daily as needed (acid reflux).   Yes [provider]  rosuvastatin (CRESTOR) 5 MG tablet Take 5 mg by mouth at bedtime.   Yes [provider]     Positive ROS: All other systems have been reviewed and were otherwise negative with the exception of those mentioned in the HPI and as above.  Physical Exam: General: Alert, no acute distress Cardiovascular: No pedal edema Respiratory: No cyanosis, no use of accessory musculature GI: No organomegaly, abdomen is soft and non-tender Skin: No lesions in the area of chief complaint Neurologic: Sensation  intact distally Psychiatric: Patient is competent for consent with normal mood and affect Lymphatic: No axillary or cervical lymphadenopathy  MUSCULOSKELETAL: left knee 0-125 with crepitance and pain, intact lachman.    Assessment: Left knee medial compartment OA history of OATS and microfracture.   Plan: Plan for Procedure(s): LEFT UNICOMPARTMENTAL KNEE  The risks benefits and alternatives were discussed with the patient including but not limited to the risks of nonoperative treatment, versus surgical intervention including infection, bleeding, nerve injury,  blood clots, cardiopulmonary complications, morbidity, mortality, among others, and they were willing to proceed.    Patient's anticipated LOS is less than 2 midnights, meeting these requirements: - Younger than 21 - Lives within 1 hour of care - Has a competent adult at home to recover with post-op recover - NO history of  - Chronic pain requiring opiods  - Diabetes  - Coronary Artery Disease  - Heart failure  - Heart attack  - Stroke  - DVT/VTE  - Cardiac arrhythmia  - Respiratory Failure/COPD  - Renal failure  - Anemia  - Advanced Liver disease       Preoperative templating of the joint replacement has been completed, documented, and submitted to the Operating Room personnel in order to optimize intra-operative equipment management.  Johnny Bridge, MD Cell [phone]   01/15/2018 11:50 AM

## 2018-01-15 NOTE — Anesthesia Postprocedure Evaluation (Signed)
Anesthesia Post Note  Patient: Terrence Gross  Procedure(s) Performed: LEFT UNICOMPARTMENTAL KNEE (Left Knee)     Patient location during evaluation: PACU Anesthesia Type: Spinal Level of consciousness: sedated Pain management: pain level controlled Vital Signs Assessment: post-procedure vital signs reviewed and stable Respiratory status: spontaneous breathing and respiratory function stable Cardiovascular status: stable Postop Assessment: no apparent nausea or vomiting Anesthetic complications: no    Last Vitals:  Vitals:   01/15/18 1547 01/15/18 1611  BP: 135/74 (!) 144/64  Pulse: 70 74  Resp: (!) 24 19  Temp:    SpO2: 96% 97%    Last Pain:  Vitals:   01/15/18 1615  TempSrc:   PainSc: 0-No pain    LLE Motor Response: Purposeful movement (01/15/18 1615) LLE Sensation: Full sensation;No numbness;No tingling (01/15/18 1615)          [place]

## 2018-01-15 NOTE — Progress Notes (Signed)
Per Dr. Jillyn Hidden, no new labs needed for pre-op.

## 2018-01-15 NOTE — Evaluation (Signed)
Physical Therapy Evaluation Patient Details Name: Terrence Gross MRN: [id number] DOB: [birth year] Today's Date: 01/15/2018   History of Present Illness  Pt is a 67 y/o male s/p L unicompartmental knee replacement. PMH includes skin cancer, CAD, HTN, sleep apnea, and back surgery.   Clinical Impression  Pt is s/p surgery above with deficits below. Pt tolerated gait training well requiringmin guard A with use of RW. Educated about knee precautions and supine HEP. Will continue to follow acutely to maximize functional mobility independence and safety.     Follow Up Recommendations Follow surgeon's recommendation for DC plan and follow-up therapies;Supervision for mobility/OOB    Equipment Recommendations  Rolling walker with 5" wheels(pt may refuse; wanting to use standard walker )    Recommendations for Other Services       Precautions / Restrictions Precautions Precautions: Knee Precaution Booklet Issued: Yes (comment) Precaution Comments: Reviewed knee precautions with pt.  Restrictions Weight Bearing Restrictions: Yes LLE Weight Bearing: Weight bearing as tolerated      Mobility  Bed Mobility Overal bed mobility: Needs Assistance Bed Mobility: Supine to Sit     Supine to sit: Supervision     General bed mobility comments: Supervision for safety.   Transfers Overall transfer level: Needs assistance Equipment used: Rolling walker (2 wheeled) Transfers: Sit to/from Stand Sit to Stand: Min assist         General transfer comment: Min A for steadying assist. Verbal cues for safe hand placement.   Ambulation/Gait Ambulation/Gait assistance: Min guard Gait Distance (Feet): 75 Feet Assistive device: Rolling walker (2 wheeled) Gait Pattern/deviations: Step-to pattern;Step-through pattern;Decreased step length - left;Decreased step length - right;Decreased weight shift to left;Antalgic Gait velocity: Decreased    General Gait Details: Slow, antalgic gait. Overall  steady with use of RW requiring min guard A. Verbal cues to push RW vs picking it up each step.   Stairs            Wheelchair Mobility    Modified Rankin (Stroke Patients Only)       Balance Overall balance assessment: Needs assistance Sitting-balance support: No upper extremity supported;Feet supported Sitting balance-Leahy Scale: Good     Standing balance support: Bilateral upper extremity supported;During functional activity Standing balance-Leahy Scale: Poor Standing balance comment: Reliant on UE support                              Pertinent Vitals/Pain Pain Assessment: Faces Faces Pain Scale: Hurts little more Pain Location: L knee  Pain Descriptors / Indicators: Aching;Operative site guarding Pain Intervention(s): Limited activity within patient's tolerance;Monitored during session;Repositioned    Home Living Family/patient expects to be discharged to:: Private residence Living Arrangements: Spouse/significant other Available Help at Discharge: Family;Available 24 hours/day Type of Home: House Home Access: Stairs to enter Entrance Stairs-Rails: None Entrance Stairs-Number of Steps: 2 Home Layout: One level Home Equipment: Walker - standard;Bedside commode      Prior Function Level of Independence: Independent               Hand Dominance        Extremity/Trunk Assessment   Upper Extremity Assessment Upper Extremity Assessment: Overall WFL for tasks assessed    Lower Extremity Assessment Lower Extremity Assessment: LLE deficits/detail LLE Deficits / Details: Sensory in tact. Deficits consistent with post op pain and weakness. Able to perform ther ex below.     Cervical / Trunk Assessment Cervical / Trunk Assessment: Normal  Communication   Communication: No difficulties  Cognition Arousal/Alertness: Awake/alert Behavior During Therapy: WFL for tasks assessed/performed Overall Cognitive Status: Within Functional Limits for  tasks assessed                                        General Comments General comments (skin integrity, edema, etc.): Pt's wife present during session     Exercises Total Joint Exercises Ankle Circles/Pumps: AROM;Both;20 reps Quad Sets: AROM;Left;10 reps Heel Slides: AROM;Left;10 reps   Assessment/Plan    PT Assessment Patient needs continued PT services  PT Problem List Decreased strength;Decreased balance;Decreased mobility;Decreased knowledge of use of DME;Decreased knowledge of precautions;Pain       PT Treatment Interventions DME instruction;Gait training;Stair training;Functional mobility training;Therapeutic activities;Therapeutic exercise;Balance training;Patient/family education    PT Goals (Current goals can be found in the Care Plan section)  Acute Rehab PT Goals Patient Stated Goal: to go home  PT Goal Formulation: With patient Time For Goal Achievement: 01/29/18 Potential to Achieve Goals: Good    Frequency 7X/week   Barriers to discharge        Co-evaluation               AM-PAC PT "6 Clicks" Daily Activity  Outcome Measure Difficulty turning over in bed (including adjusting bedclothes, sheets and blankets)?: None Difficulty moving from lying on back to sitting on the side of the bed? : A Little Difficulty sitting down on and standing up from a chair with arms (e.g., wheelchair, bedside commode, etc,.)?: Unable Help needed moving to and from a bed to chair (including a wheelchair)?: A Little Help needed walking in hospital room?: A Little Help needed climbing 3-5 steps with a railing? : A Little 6 Click Score: 17    End of Session Equipment Utilized During Treatment: Gait belt;Left knee immobilizer Activity Tolerance: Patient tolerated treatment well Patient left: in chair;with call bell/phone within reach;with family/visitor present Nurse Communication: Mobility status PT Visit Diagnosis: Other abnormalities of gait and  mobility (R26.89);Pain Pain - Right/Left: Left Pain - part of body: Knee    Time: 1711-1736 PT Time Calculation (min) (ACUTE ONLY): 25 min   Charges:   PT Evaluation $PT Eval Low Complexity: 1 Low PT Treatments $Gait Training: 8-22 mins        Leighton Ruff, PT, DPT  Acute Rehabilitation Services  Pager: [phone]   Rudean Hitt 01/15/2018, 5:41 PM

## 2018-01-15 NOTE — Transfer of Care (Signed)
Immediate Anesthesia Transfer of Care Note  Patient: Terrence Gross  Procedure(s) Performed: LEFT UNICOMPARTMENTAL KNEE (Left Knee)  Patient Location: PACU  Anesthesia Type:GA combined with regional for post-op pain  Level of Consciousness: awake, alert  and oriented  Airway & Oxygen Therapy: Patient Spontanous Breathing and Patient connected to face mask oxygen  Post-op Assessment: Report given to RN and Post -op Vital signs reviewed and stable  Post vital signs: Reviewed and stable  Last Vitals:  Vitals Value Taken Time  BP 138/78 01/15/2018  3:17 PM  Temp    Pulse 72 01/15/2018  3:19 PM  Resp 17 01/15/2018  3:19 PM  SpO2 90 % 01/15/2018  3:19 PM  Vitals shown include unvalidated device data.  Last Pain:  Vitals:   01/15/18 1036  TempSrc:   PainSc: 0-No pain      Patients Stated Pain Goal: 3 (23/36/12 2449)  Complications: No apparent anesthesia complications

## 2018-01-15 NOTE — Anesthesia Procedure Notes (Addendum)
Anesthesia Regional Block: Adductor canal block   Pre-Anesthetic Checklist: ,, timeout performed, Correct Patient, Correct Site, Correct Laterality, Correct Procedure, Correct Position, site marked, Risks and benefits discussed,  Surgical consent,  Pre-op evaluation,  At surgeon's request and post-op pain management  Laterality: Left and Lower  Prep: chloraprep       Needles:  Injection technique: Single-shot  Needle Type: Echogenic Stimulator Needle     Needle Length: 10cm  Needle Gauge: 21   Needle insertion depth: 3 cm   Additional Needles:   Procedures:,,,, ultrasound used (permanent image in chart),,,,  Narrative:  Start time: 01/15/2018 11:42 AM End time: 01/15/2018 11:52 AM Injection made incrementally with aspirations every 5 mL. Anesthesiologist: Lyn Hollingshead, MD

## 2018-01-15 NOTE — Anesthesia Procedure Notes (Signed)
Procedure Name: LMA Insertion Date/Time: 01/15/2018 12:58 PM Performed by: Colin Benton, CRNA Pre-anesthesia Checklist: Patient identified, Emergency Drugs available, Patient being monitored and Suction available Patient Re-evaluated:Patient Re-evaluated prior to induction Oxygen Delivery Method: Circle system utilized Preoxygenation: Pre-oxygenation with 100% oxygen Induction Type: IV induction Ventilation: Mask ventilation without difficulty LMA: LMA inserted LMA Size: 5.0 Number of attempts: 1 Placement Confirmation: positive ETCO2 and breath sounds checked- equal and bilateral Tube secured with: Tape Dental Injury: Teeth and Oropharynx as per pre-operative assessment

## 2018-01-15 NOTE — Anesthesia Preprocedure Evaluation (Addendum)
Anesthesia Evaluation  Patient identified by MRN, date of birth, ID band Patient awake    Reviewed: Allergy & Precautions, NPO status , Patient's Chart, lab work & pertinent test results  History of Anesthesia Complications (+) PONV and history of anesthetic complications  Airway Mallampati: I       Dental no notable dental hx. (+) Teeth Intact   Pulmonary sleep apnea ,    Pulmonary exam normal breath sounds clear to auscultation       Cardiovascular hypertension, Pt. on medications and Pt. on home beta blockers Normal cardiovascular exam Rhythm:Regular Rate:Normal     Neuro/Psych negative neurological ROS  negative psych ROS   GI/Hepatic negative GI ROS, Neg liver ROS,   Endo/Other  negative endocrine ROS  Renal/GU negative Renal ROS     Musculoskeletal   Abdominal (+) + obese,   Peds  Hematology negative hematology ROS (+)   Anesthesia Other Findings   Reproductive/Obstetrics                             Anesthesia Physical Anesthesia Plan  ASA: II  Anesthesia Plan: General   Post-op Pain Management:  Regional for Post-op pain   Induction:   PONV Risk Score and Plan: 2 and Ondansetron and Dexamethasone  Airway Management Planned: LMA  Additional Equipment:   Intra-op Plan:   Post-operative Plan: Extubation in OR  Informed Consent: I have reviewed the patients History and Physical, chart, labs and discussed the procedure including the risks, benefits and alternatives for the proposed anesthesia with the patient or authorized representative who has indicated his/her understanding and acceptance.     Plan Discussed with: CRNA and Surgeon  Anesthesia Plan Comments:        Anesthesia Quick Evaluation

## 2018-01-15 NOTE — Discharge Instructions (Signed)

## 2018-01-15 NOTE — Op Note (Signed)
01/15/2018  2:51 PM  PATIENT:  Terrence Gross    PRE-OPERATIVE DIAGNOSIS: left knee primary localized osteoarthritis  POST-OPERATIVE DIAGNOSIS:  Same  PROCEDURE:  Unicompartmental Knee Arthroplasty, left  SURGEON:  Johnny Bridge, MD  PHYSICIAN ASSISTANT: Joya Gaskins, OPA-C, present and scrubbed throughout the case, critical for completion in a timely fashion, and for retraction, instrumentation, and closure.  ANESTHESIA:   general  ESTIMATED BLOOD LOSS: 1108ml  UNIQUE ASPECTS OF THE CASE:  The cartilage on the tibia was intact.  The femur had extensive chondral damage, although not really exposed bone, but definite extensive chondromalacia that was extending from the anterior femur down around posteriorly within the weightbearing portion.  I had to cut the tibia twice.  The knee was challenging to gain access posteriorly, there may have been a small flake of cement in the posterior capsular fold which I was not able to access at the completion of the cementing technique.  PREOPERATIVE INDICATIONS:  Terrence Gross is a  67 y.o. male who has had previous osteochondral grafting as well as microfracture done back in January who had persistent significant pain with inability to do sports and inability to walk without significant pain elected for surgical management.  The risks benefits and alternatives were discussed with the patient preoperatively including but not limited to the risks of infection, bleeding, nerve injury, cardiopulmonary complications, blood clots, the need for revision surgery, among others, and the patient was willing to proceed.  OPERATIVE IMPLANTS: [employer] mobile bearing medial compartment arthroplasty femur size large, tibia size C, bearing size 3.  OPERATIVE FINDINGS: The medial weightbearing portion with degenerative chondral tissue, although no fully exposed bone, definitely poor quality tissue.  No significant changes in the lateral or patellofemoral  joint.  The ACL was intact.  OPERATIVE PROCEDURE: The patient was brought to the operating room placed in the supine position. General anesthesia was administered. IV antibiotics were given. The lower extremity was placed in the legholder and prepped and draped in usual sterile fashion.  Time out was performed.  The leg was elevated and exsanguinated and the tourniquet was inflated. Anteromedial incision was performed, and I took care to preserve the MCL. Parapatellar incision was carried out, and the osteophytes were excised, along with the medial meniscus and a small portion of the fat pad.  The extra medullary tibial cutting jig was applied, using the spoon and the 38mm G-Clamp and the 2 mm shim, and I took care to protect the anterior cruciate ligament insertion and the tibial spine. The medial collateral ligament was also protected, and I resected my proximal tibia, matching the anatomic slope.   The proximal tibial bony cut was removed in one piece, and I turned my attention to the femur.  The intramedullary femoral rod was placed using the drill, and then using the appropriate reference, I assembled the femoral jig, setting my posterior cutting block. I resected my posterior femur, used the 0 spigot for the anterior femur, and then measured my gap.  The gap was still too tight posteriorly, so I went back and dissected an additional 2 mm using the 0 millimeter shim on the tibia.  I then used the appropriate mill to match the extension gap to the flexion gap. The second milling was at a 3.  The gaps were then measured again with the appropriate feeler gauges. Once I had balanced flexion and extension gaps, I then completed the preparation of the femur.  I milled off the anterior  aspect of the distal femur to prevent impingement. I also exposed the tibia, and selected the above-named component, and then used the cutting jig to prepare the keel slot on the tibia. I also used the awl to curette out  the bone to complete the preparation of the keel. The back wall was intact.  I then placed trial components, and it was found to have excellent motion, and appropriate balance.  I then cemented the components into place, cementing the tibia first, removing all excess cement, and then cementing the femur.  All loose cement was removed, although there was a very small flake of cement was tunneled from the fold of the popliteal recess which I would have able to access, it did not appear to impinge with the prosthesis or impact the arthroplasty..  The real polyethylene insert was applied manually, and the knee was taken through functional range of motion, and found to have excellent stability and restoration of joint motion, with excellent balance.  The wounds were irrigated copiously, and the parapatellar tissue closed with Vicryl, followed by Vicryl for the subcutaneous tissue, with routine closure with Steri-Strips and sterile gauze.  The tourniquet was released, and the patient was awakened and extubated and returned to PACU in stable and satisfactory condition. There were no complications.

## 2018-01-16 ENCOUNTER — Encounter (HOSPITAL_COMMUNITY): Payer: Self-pay | Admitting: Orthopedic Surgery

## 2018-01-16 ENCOUNTER — Other Ambulatory Visit: Payer: Self-pay

## 2018-01-16 DIAGNOSIS — M1712 Unilateral primary osteoarthritis, left knee: Secondary | ICD-10-CM | POA: Diagnosis not present

## 2018-01-16 LAB — CBC
HCT: 38.6 % — ABNORMAL LOW (ref 39.0–52.0)
HEMOGLOBIN: 12.8 g/dL — AB (ref 13.0–17.0)
MCH: 31.1 pg (ref 26.0–34.0)
MCHC: 33.2 g/dL (ref 30.0–36.0)
MCV: 93.7 fL (ref 78.0–100.0)
Platelets: 197 10*3/uL (ref 150–400)
RBC: 4.12 MIL/uL — ABNORMAL LOW (ref 4.22–5.81)
RDW: 12.4 % (ref 11.5–15.5)
WBC: 13.8 10*3/uL — AB (ref 4.0–10.5)

## 2018-01-16 LAB — BASIC METABOLIC PANEL
ANION GAP: 7 (ref 5–15)
BUN: 21 mg/dL (ref 8–23)
CALCIUM: 8.6 mg/dL — AB (ref 8.9–10.3)
CHLORIDE: 106 mmol/L (ref 98–111)
CO2: 24 mmol/L (ref 22–32)
Creatinine, Ser: 1.17 mg/dL (ref 0.61–1.24)
GFR calc non Af Amer: >60 mL/min (ref >60)
GLUCOSE: 170 mg/dL — AB (ref 70–99)
Potassium: 4.5 mmol/L (ref 3.5–5.1)
Sodium: 137 mmol/L (ref 135–145)

## 2018-01-16 NOTE — Progress Notes (Signed)
Physical Therapy Treatment & Discharge Patient Details Name: Terrence Gross MRN: [id number] DOB: 08/09/1950 Today's Date: 01/16/2018    History of Present Illness Pt is a 67 y/o male s/p L unicompartmental knee replacement on 01/15/18. PMH includes skin cancer, CAD, HTN, sleep apnea, back surgery.    PT Comments    Pt progressing well with mobility. Able to ascend/descend steps with UE support and supervision. Pt mod indep ambulating with RW and SPC. Pt has no further questions or concerns. Pt has short-term acute PT goals. Will d/c acute PT.   Follow Up Recommendations  Follow surgeon's recommendation for DC plan and follow-up therapies;Supervision for mobility/OOB     Equipment Recommendations  None recommended by PT    Recommendations for Other Services       Precautions / Restrictions Precautions Precautions: Knee Precaution Comments: Verbally reviewed precautions Restrictions Weight Bearing Restrictions: Yes LLE Weight Bearing: Weight bearing as tolerated    Mobility  Bed Mobility Overal bed mobility: Independent                Transfers Overall transfer level: Modified independent Equipment used: Rolling walker (2 wheeled) Transfers: Sit to/from Stand Sit to Stand: Modified independent (Device/Increase time)            Ambulation/Gait Ambulation/Gait assistance: Modified independent (Device/Increase time) Gait Distance (Feet): 300 Feet Assistive device: Rolling walker (2 wheeled);Straight cane Gait Pattern/deviations: Step-through pattern;Decreased stride length Gait velocity: Decreased  Gait velocity interpretation: 1.31 - 2.62 ft/sec, indicative of limited community ambulator General Gait Details: Slow, steady amb with RW; mod indep with RW and with SPC   Stairs Stairs: Yes Stairs assistance: Supervision Stair Management: One rail Left Number of Stairs: 4 General stair comments: Ascend/descended steps with single UE support; supervision  for safety. Wife present for [employer]   Wheelchair Mobility    Modified Rankin (Stroke Patients Only)       Balance Overall balance assessment: Needs assistance Sitting-balance support: No upper extremity supported;Feet supported Sitting balance-Leahy Scale: Good       Standing balance-Leahy Scale: Fair Standing balance comment: Can take steps with and without UE support                            Cognition Arousal/Alertness: Awake/alert Behavior During Therapy: WFL for tasks assessed/performed Overall Cognitive Status: Within Functional Limits for tasks assessed                                        Exercises Other Exercises Other Exercises: L knee AROM >90' flexion    General Comments General comments (skin integrity, edema, etc.): Wife present during session      Pertinent Vitals/Pain Pain Assessment: Faces Faces Pain Scale: Hurts a little bit Pain Location: L knee  Pain Descriptors / Indicators: Sore Pain Intervention(s): Monitored during session    Home Living                      Prior Function            PT Goals (current goals can now be found in the care plan section) Acute Rehab PT Goals Patient Stated Goal: to go home  PT Goal Formulation: With patient Time For Goal Achievement: 01/29/18 Potential to Achieve Goals: Good Progress towards PT goals: Progressing toward goals    Frequency    7X/week  PT Plan Current plan remains appropriate    Co-evaluation              AM-PAC PT "6 Clicks" Daily Activity  Outcome Measure  Difficulty turning over in bed (including adjusting bedclothes, sheets and blankets)?: None Difficulty moving from lying on back to sitting on the side of the bed? : None Difficulty sitting down on and standing up from a chair with arms (e.g., wheelchair, bedside commode, etc,.)?: None Help needed moving to and from a bed to chair (including a wheelchair)?: None Help  needed walking in hospital room?: None Help needed climbing 3-5 steps with a railing? : A Little 6 Click Score: 23    End of Session Equipment Utilized During Treatment: Gait belt Activity Tolerance: Patient tolerated treatment well Patient left: in chair;with call bell/phone within reach;with family/visitor present Nurse Communication: Mobility status PT Visit Diagnosis: Other abnormalities of gait and mobility (R26.89);Pain Pain - Right/Left: Left Pain - part of body: Knee     Time: 0814-4818 PT Time Calculation (min) (ACUTE ONLY): 24 min  Charges:  $Gait Training: 23-37 mins                    Mabeline Caras, PT, DPT Acute Rehab Services  Pager: [place] 01/16/2018, 10:57 AM

## 2018-01-16 NOTE — Addendum Note (Signed)
Addendum  created 01/16/18 1435 by Duane Boston, MD   Sign clinical note

## 2018-01-16 NOTE — Plan of Care (Signed)
  Problem: Education: Goal: Knowledge of General Education information will improve Description Including pain rating scale, medication(s)/side effects and non-pharmacologic comfort measures Outcome: Progressing   Problem: Clinical Measurements: Goal: Will remain free from infection Outcome: Progressing   Problem: Activity: Goal: Risk for activity intolerance will decrease Outcome: Progressing   Problem: Coping: Goal: Level of anxiety will decrease Outcome: Progressing   Problem: Activity: Goal: Ability to avoid complications of mobility impairment will improve Outcome: Progressing Goal: Range of joint motion will improve Outcome: Progressing   Problem: Pain Management: Goal: Pain level will decrease with appropriate interventions Outcome: Progressing   Problem: Skin Integrity: Goal: Will show signs of wound healing Outcome: Progressing

## 2018-01-16 NOTE — Discharge Summary (Signed)
Physician Discharge Summary  Patient ID: Terrence Gross MRN: [id number] DOB/AGE: 12-31-1950 67 y.o.  Admit date: 01/15/2018 Discharge date: 01/16/2018  Admission Diagnoses:  Primary localized osteoarthritis of left knee  Discharge Diagnoses:  Principal Problem:   Primary localized osteoarthritis of left knee Active Problems:   S/P left unicompartmental knee replacement   Past Medical History:  Diagnosis Date  . Arthritis   . Cancer (HCC)    Squamous and basal cell skin cancer  . Complication of anesthesia   . Coronary artery disease   . Hypertension   . PONV (postoperative nausea and vomiting)    1997  . Primary localized osteoarthritis of left knee 01/15/2018  . Sleep apnea     Surgeries: Procedure(s): LEFT UNICOMPARTMENTAL KNEE on 01/15/2018   Consultants (if any):   Discharged Condition: Improved  Hospital Course: Terrence Gross is an 67 y.o. male who was admitted 01/15/2018 with a diagnosis of Primary localized osteoarthritis of left knee and went to the operating room on 01/15/2018 and underwent the above named procedures.    He was given perioperative antibiotics:  Anti-infectives (From admission, onward)   Start     Dose/Rate Route Frequency Ordered Stop   01/15/18 1900  ceFAZolin (ANCEF) IVPB 2g/100 mL premix     2 g 200 mL/hr over 30 Minutes Intravenous Every 6 hours 01/15/18 1637 01/16/18 0540   01/15/18 1230  ceFAZolin (ANCEF) IVPB 2g/100 mL premix     2 g 200 mL/hr over 30 Minutes Intravenous 30 min pre-op 01/15/18 1207 01/15/18 1254   01/15/18 1208  ceFAZolin (ANCEF) 2-4 GM/100ML-% IVPB    Note to Pharmacy:  Grace Blight   : cabinet override      01/15/18 1208 01/15/18 1254   01/08/18 0900  ceFAZolin (ANCEF) 3 g in dextrose 5 % 50 mL IVPB     3 g 100 mL/hr over 30 Minutes Intravenous To ShortStay Surgical 01/07/18 0908 01/09/18 0900    .  He was given sequential compression devices, early ambulation, and aspirin for DVT prophylaxis.  He  benefited maximally from the hospital stay and there were no complications.    Recent vital signs:  Vitals:   01/16/18 0023 01/16/18 0554  BP: 128/63 (!) 130/55  Pulse: 70 67  Resp: 16 16  Temp: 98.2 F (36.8 C) 97.8 F (36.6 C)  SpO2: 97% 97%    Recent laboratory studies:  Lab Results  Component Value Date   HGB 12.8 (L) 01/16/2018   HGB 14.6 12/28/2017   Lab Results  Component Value Date   WBC 13.8 (H) 01/16/2018   PLT 197 01/16/2018   No results found for: INR Lab Results  Component Value Date   NA 137 01/16/2018   K 4.5 01/16/2018   CL 106 01/16/2018   CO2 24 01/16/2018   BUN 21 01/16/2018   CREATININE 1.17 01/16/2018   GLUCOSE 170 (H) 01/16/2018    Discharge Medications:   Allergies as of 01/16/2018      Reactions   Tape Rash, Other (See Comments)   BLISTERS [PAPER TAPE OK TO USE]   Other Rash   SILK TAPE      Medication List    TAKE these medications   acetaminophen 500 MG tablet Commonly known as:  TYLENOL Take 1,000 mg by mouth daily as needed for moderate pain or headache.   aspirin EC 325 MG tablet Take 1 tablet (325 mg total) by mouth daily. What changed:    medication strength  how  much to take  when to take this   baclofen 10 MG tablet Commonly known as:  LIORESAL Take 1 tablet (10 mg total) by mouth 3 (three) times daily. As needed for muscle spasm   bisoprolol-hydrochlorothiazide 10-6.25 MG tablet Commonly known as:  ZIAC Take 0.5 tablets by mouth daily.   ibuprofen 200 MG tablet Commonly known as:  ADVIL,MOTRIN Take 800 mg by mouth daily as needed for moderate pain.   losartan 100 MG tablet Commonly known as:  COZAAR Take 100 mg by mouth daily.   omeprazole 20 MG tablet Commonly known as:  PRILOSEC OTC Take 20 mg by mouth daily as needed (acid reflux).   ondansetron 4 MG tablet Commonly known as:  ZOFRAN Take 1 tablet (4 mg total) by mouth every 8 (eight) hours as needed for nausea or vomiting.   oxyCODONE 5 MG  immediate release tablet Commonly known as:  Oxy IR/ROXICODONE Take 1 tablet (5 mg total) by mouth every 4 (four) hours as needed for severe pain.   rosuvastatin 5 MG tablet Commonly known as:  CRESTOR Take 5 mg by mouth at bedtime.   sennosides-docusate sodium 8.6-50 MG tablet Commonly known as:  SENOKOT-S Take 2 tablets by mouth daily.       Diagnostic Studies: Dg Knee Left Port  Result Date: 01/15/2018 CLINICAL DATA:  Status post partial left knee replacement EXAM: PORTABLE LEFT KNEE - 1-2 VIEW COMPARISON:  None. FINDINGS: Medial knee replacement is noted. No acute bony or soft tissue abnormality is seen. IMPRESSION: Status post medial left knee replacement Electronically Signed   By: Inez Catalina M.D.   On: 01/15/2018 16:34    Disposition:     Follow-up Information    Marchia Bond, MD. Schedule an appointment as soon as possible for a visit in 2 weeks.   Specialty:  Orthopedic Surgery Contact information: [address] [place] [address] [phone]            Signed: Johnny Bridge 01/16/2018, 8:45 AM

## 2018-01-16 NOTE — Progress Notes (Signed)
IV discontined. No complications observed to site and pt tolerated well. Given AVS with no questions. Called for wheelchair service.

## 2020-08-03 IMAGING — DX DG KNEE 1-2V PORT*L*
2 series · 2 of 2 positions shown · non-contrast
Comparison: None.

CLINICAL DATA: Status post partial left knee replacement

EXAM:
PORTABLE LEFT KNEE - 1-2 VIEW

[knee ap]
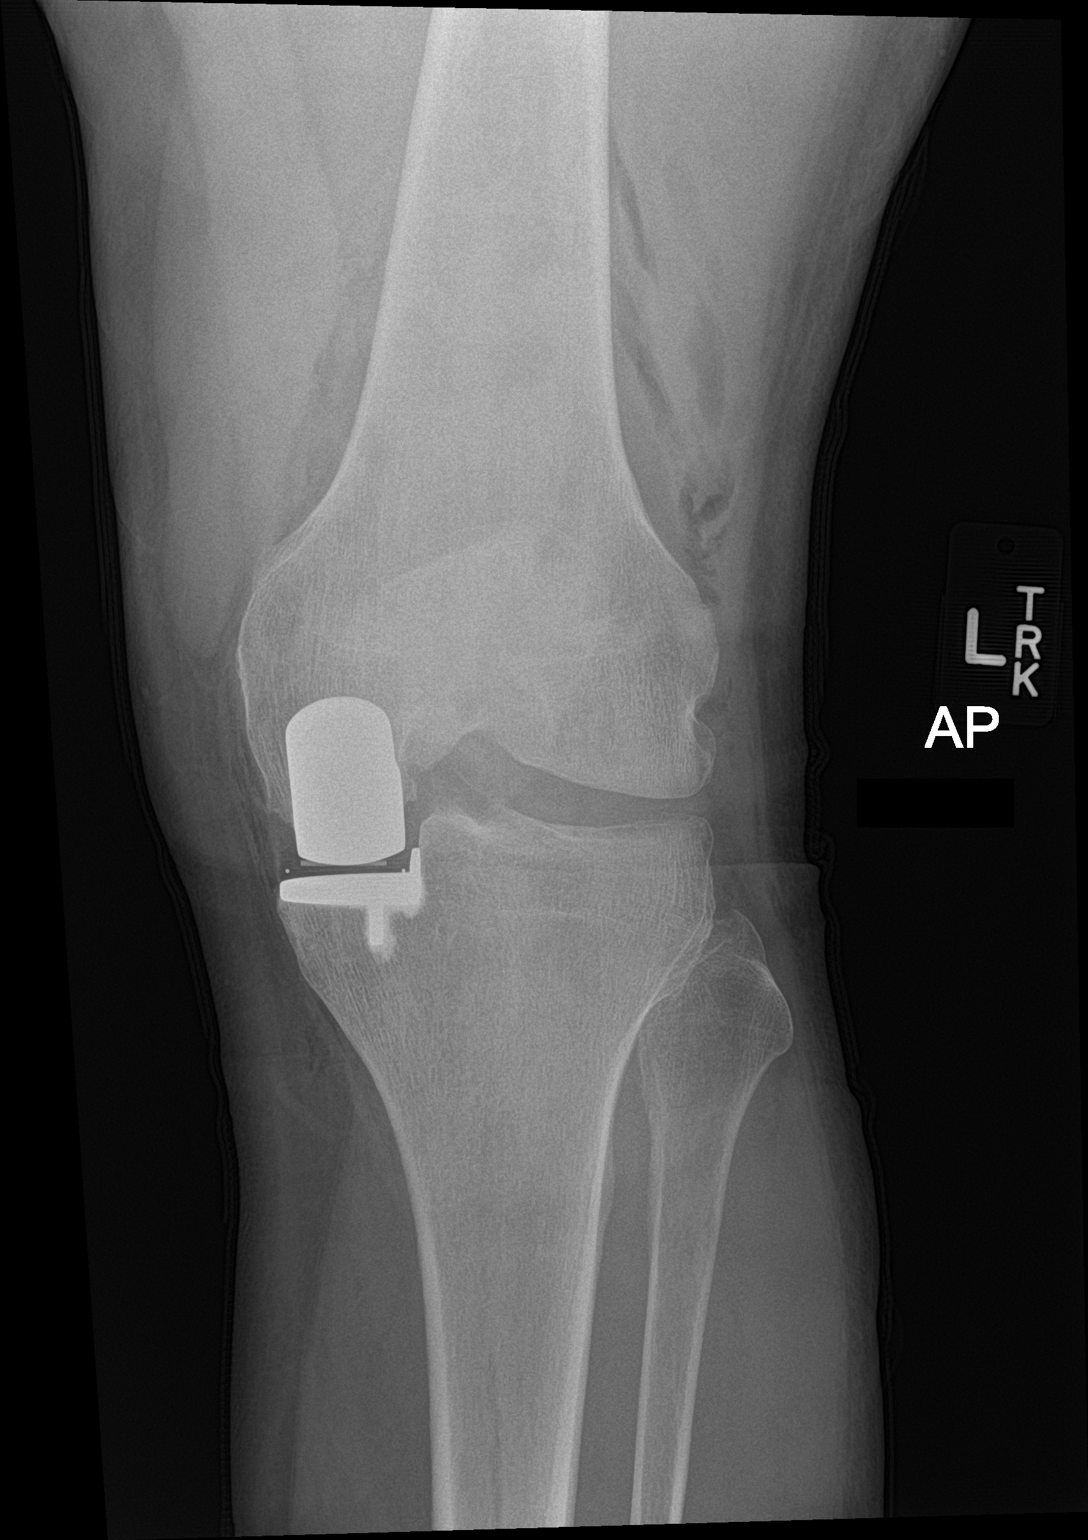

[knee lat]
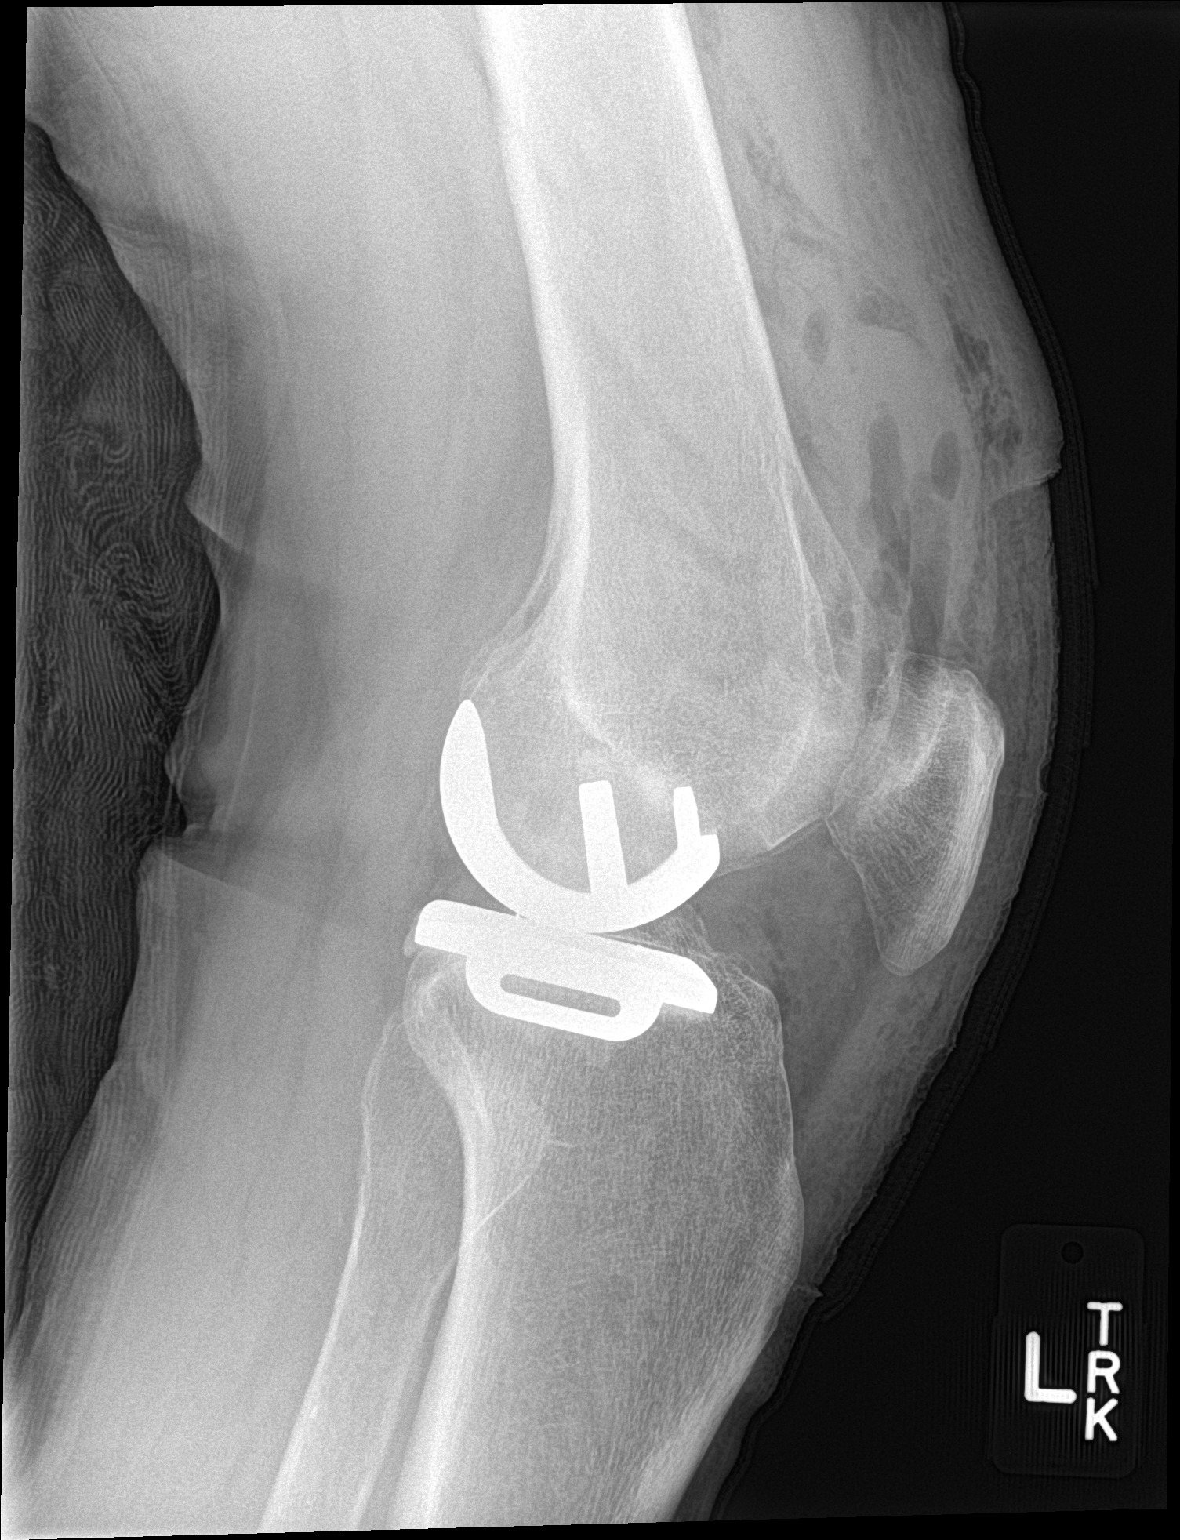

[2 of 2 positions shown; findings below may reference images not displayed]

FINDINGS: Medial knee replacement is noted. No acute bony or soft tissue
abnormality is seen.
IMPRESSION: Status post medial left knee replacement

## 2021-02-08 DIAGNOSIS — M1711 Unilateral primary osteoarthritis, right knee: Secondary | ICD-10-CM | POA: Diagnosis present

## 2021-02-08 NOTE — Progress Notes (Signed)
Sent message, via epic in basket, requesting orders in epic from surgeon.  

## 2021-02-15 NOTE — Progress Notes (Addendum)
nesthesia Review:  PCP: DR Thalia Party clearance dated 9/2/2 on chart  LOV 12/17/20  Cardiologist : DR Stephenie Acres clearance dated 01/21/21 on chart  LOV 01/19/21 on chart  EP- DR Bettey Costa- LOV 01/19/21 Chest x-ray : EKG :Requested 12 lead ekg by fax.  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

## 2021-02-15 NOTE — Progress Notes (Signed)
DUE TO COVID-19 ONLY ONE VISITOR IS ALLOWED TO COME WITH YOU AND STAY IN THE WAITING ROOM ONLY DURING PRE OP AND PROCEDURE DAY OF SURGERY.  2 VISITOR  MAY VISIT WITH YOU AFTER SURGERY IN YOUR PRIVATE ROOM DURING VISITING HOURS ONLY!  YOU NEED TO HAVE A COVID 19 TEST ON_  02/25/2021 _____@_  @_from  8am-3pm _____, THIS TEST MUST BE DONE BEFORE SURGERY,  Covid test is done at [place], [place] [address].  This is a drive thru.  No appt required. Please see map.                 Your procedure is scheduled on:           03/01/2021   Report to [facility] Main  Entrance   Report to admitting at     [phone]     Call this number if you have problems the morning of surgery [phone]    REMEMBER: NO  SOLID FOOD CANDY OR GUM AFTER MIDNIGHT. CLEAR LIQUIDS UNTIL         . NOTHING BY MOUTH EXCEPT [id number] EAR LIQUIDS UNTIL   0730am     . PLEASE FINISH ENSURE DRINK PER SURGEON ORDER  WHICH NEEDS TO BE COMPLETED AT 0730am      .      CLEAR LIQUID DIET   Foods Allowed                                                                    Coffee and tea, regular and decaf                            Fruit ices (not with fruit pulp)                                      Iced Popsicles                                    Carbonated beverages, regular and diet                                    Cranberry, grape and apple juices Sports drinks like Gatorade Lightly seasoned clear broth or consume(fat free) Sugar, honey syrup ___________________________________________________________________      BRUSH YOUR TEETH MORNING OF SURGERY AND RINSE YOUR MOUTH OUT, NO CHEWING GUM CANDY OR MINTS.     Take these medicines the morning of surgery with A SIP OF WATER:  none   DO NOT TAKE ANY DIABETIC MEDICATIONS DAY OF YOUR SURGERY                               You may not have any metal on your body including hair pins and              piercings  Do not wear jewelry, make-up, lotions,  powders or perfumes, deodorant  Do not wear nail polish on your fingernails.  Do not shave  48 hours prior to surgery.              Men may shave face and neck.   Do not bring valuables to the hospital. [place].  Contacts, dentures or bridgework may not be worn into surgery.  Leave suitcase in the car. After surgery it may be brought to your room.     Patients discharged the day of surgery will not be allowed to drive home. IF YOU ARE HAVING SURGERY AND GOING HOME THE SAME DAY, YOU MUST HAVE AN ADULT TO DRIVE YOU HOME AND BE WITH YOU FOR 24 HOURS. YOU MAY GO HOME BY TAXI OR UBER OR ORTHERWISE, BUT AN ADULT MUST ACCOMPANY YOU HOME AND STAY WITH YOU FOR 24 HOURS.  Name and phone number of your driver:  Special Instructions: N/A              Please read over the following fact sheets you were given: _____________________________________________________________________  [facility] - Preparing for Surgery Before surgery, you can play an important role.  Because skin is not sterile, your skin needs to be as free of germs as possible.  You can reduce the number of germs on your skin by washing with CHG (chlorahexidine gluconate) soap before surgery.  CHG is an antiseptic cleaner which kills germs and bonds with the skin to continue killing germs even after washing. Please DO NOT use if you have an allergy to CHG or antibacterial soaps.  If your skin becomes reddened/irritated stop using the CHG and inform your nurse when you arrive at Short Stay. Do not shave (including legs and underarms) for at least 48 hours prior to the first CHG shower.  You may shave your face/neck. Please follow these instructions carefully:  1.  Shower with CHG Soap the night before surgery and the  morning of Surgery.  2.  If you choose to wash your hair, wash your hair first as usual with your  normal  shampoo.  3.  After you shampoo, rinse your hair and body  thoroughly to remove the  shampoo.                           4.  Use CHG as you would any other liquid soap.  You can apply chg directly  to the skin and wash                       Gently with a scrungie or clean washcloth.  5.  Apply the CHG Soap to your body ONLY FROM THE NECK DOWN.   Do not use on face/ open                           Wound or open sores. Avoid contact with eyes, ears mouth and genitals (private parts).                       Wash face,  Genitals (private parts) with your normal soap.             6.  Wash thoroughly, paying special attention to the area where your surgery  will be performed.  7.  Thoroughly rinse your body with  warm water from the neck down.  8.  DO NOT shower/wash with your normal soap after using and rinsing off  the CHG Soap.                9.  Pat yourself dry with a clean towel.            10.  Wear clean pajamas.            11.  Place clean sheets on your bed the night of your first shower and do not  sleep with pets. Day of Surgery : Do not apply any lotions/deodorants the morning of surgery.  Please wear clean clothes to the hospital/surgery center.  FAILURE TO FOLLOW THESE INSTRUCTIONS MAY RESULT IN THE CANCELLATION OF YOUR SURGERY PATIENT SIGNATURE_________________________________  NURSE SIGNATURE__________________________________  ________________________________________________________________________

## 2021-02-16 ENCOUNTER — Encounter (HOSPITAL_COMMUNITY): Payer: Self-pay

## 2021-02-16 ENCOUNTER — Other Ambulatory Visit: Payer: Self-pay

## 2021-02-16 ENCOUNTER — Encounter (HOSPITAL_COMMUNITY)
Admission: RE | Admit: 2021-02-16 | Discharge: 2021-02-16 | Disposition: A | Discharge status: Home / Self Care | Payer: Medicare PPO | Source: Ambulatory Visit | Attending: Orthopedic Surgery | Admitting: Orthopedic Surgery

## 2021-02-16 DIAGNOSIS — I2510 Atherosclerotic heart disease of native coronary artery without angina pectoris: Secondary | ICD-10-CM | POA: Insufficient documentation

## 2021-02-16 DIAGNOSIS — M1711 Unilateral primary osteoarthritis, right knee: Secondary | ICD-10-CM | POA: Insufficient documentation

## 2021-02-16 DIAGNOSIS — Z79899 Other long term (current) drug therapy: Secondary | ICD-10-CM | POA: Diagnosis not present

## 2021-02-16 DIAGNOSIS — Z7901 Long term (current) use of anticoagulants: Secondary | ICD-10-CM | POA: Insufficient documentation

## 2021-02-16 DIAGNOSIS — I4892 Unspecified atrial flutter: Secondary | ICD-10-CM | POA: Insufficient documentation

## 2021-02-16 DIAGNOSIS — G4730 Sleep apnea, unspecified: Secondary | ICD-10-CM | POA: Diagnosis not present

## 2021-02-16 DIAGNOSIS — Z981 Arthrodesis status: Secondary | ICD-10-CM | POA: Diagnosis not present

## 2021-02-16 DIAGNOSIS — I10 Essential (primary) hypertension: Secondary | ICD-10-CM | POA: Insufficient documentation

## 2021-02-16 DIAGNOSIS — Z01818 Encounter for other preprocedural examination: Secondary | ICD-10-CM | POA: Diagnosis not present

## 2021-02-16 DIAGNOSIS — Z7982 Long term (current) use of aspirin: Secondary | ICD-10-CM | POA: Insufficient documentation

## 2021-02-16 HISTORY — DX: Cardiac arrhythmia, unspecified: I49.9

## 2021-02-16 LAB — CBC
HCT: 46.1 % (ref 39.0–52.0)
Hemoglobin: 15 g/dL (ref 13.0–17.0)
MCH: 29.4 pg (ref 26.0–34.0)
MCHC: 32.5 g/dL (ref 30.0–36.0)
MCV: 90.2 fL (ref 80.0–100.0)
Platelets: 200 10*3/uL (ref 150–400)
RBC: 5.11 MIL/uL (ref 4.22–5.81)
RDW: 14.2 % (ref 11.5–15.5)
WBC: 8.4 10*3/uL (ref 4.0–10.5)
nRBC: 0 % (ref 0.0–0.2)

## 2021-02-16 LAB — BASIC METABOLIC PANEL
Anion gap: 8 (ref 5–15)
BUN: 16 mg/dL (ref 8–23)
CO2: 26 mmol/L (ref 22–32)
Calcium: 9.5 mg/dL (ref 8.9–10.3)
Chloride: 107 mmol/L (ref 98–111)
Creatinine, Ser: 1.05 mg/dL (ref 0.61–1.24)
GFR, Estimated: >60 mL/min (ref >60)
Glucose, Bld: 86 mg/dL (ref 70–99)
Potassium: 4.2 mmol/L (ref 3.5–5.1)
Sodium: 141 mmol/L (ref 135–145)

## 2021-02-16 LAB — SURGICAL PCR SCREEN
MRSA, PCR: NEGATIVE
Staphylococcus aureus: NEGATIVE

## 2021-02-17 LAB — HEMOGLOBIN A1C
Hgb A1c MFr Bld: 5.7 % — ABNORMAL HIGH (ref 4.8–5.6)
Mean Plasma Glucose: 117 mg/dL

## 2021-02-22 NOTE — Anesthesia Preprocedure Evaluation (Addendum)
Anesthesia Evaluation  Patient identified by MRN, date of birth, ID band Patient awake    Reviewed: Allergy & Precautions, NPO status , Patient's Chart, lab work & pertinent test results  History of Anesthesia Complications (+) PONV and history of anesthetic complications  Airway Mallampati: III  TM Distance: >3 FB Neck ROM: Full    Dental no notable dental hx.    Pulmonary sleep apnea and Continuous Positive Airway Pressure Ventilation ,    Pulmonary exam normal breath sounds clear to auscultation       Cardiovascular hypertension, Pt. on medications and Pt. on home beta blockers + CAD  Normal cardiovascular exam+ dysrhythmias Atrial Fibrillation  Rhythm:Regular Rate:Normal     Neuro/Psych negative neurological ROS  negative psych ROS   GI/Hepatic negative GI ROS, Neg liver ROS,   Endo/Other  negative endocrine ROS  Renal/GU negative Renal ROS     Musculoskeletal  (+) Arthritis , S/p lumbar fusion   Abdominal (+) + obese,   Peds  Hematology  (+) Blood dyscrasia, , HLD   Anesthesia Other Findings OA RIGHT KNEE  Reproductive/Obstetrics                            Anesthesia Physical Anesthesia Plan  ASA: 2  Anesthesia Plan: General and Regional   Post-op Pain Management: GA combined w/ Regional for post-op pain   Induction: Intravenous  PONV Risk Score and Plan: 3 and Ondansetron, Dexamethasone, Midazolam and Treatment may vary due to age or medical condition  Airway Management Planned: LMA  Additional Equipment:   Intra-op Plan:   Post-operative Plan: Extubation in OR  Informed Consent: I have reviewed the patients History and Physical, chart, labs and discussed the procedure including the risks, benefits and alternatives for the proposed anesthesia with the patient or authorized representative who has indicated his/her understanding and acceptance.     Dental advisory  given  Plan Discussed with: CRNA  Anesthesia Plan Comments: (Reviewed PAT note 02/16/2021, Konrad Felix Larsen, PA-C)       Anesthesia Quick Evaluation

## 2021-02-22 NOTE — Progress Notes (Signed)
Anesthesia Chart Review   Case: [id number] Date/Time: 03/01/21 1015   Procedure: TOTAL KNEE ARTHROPLASTY (Right: Knee)   Anesthesia type: Choice   Pre-op diagnosis: OA RIGHT KNEE   Location: [facility]   Surgeons: Marchia Bond, MD       DISCUSSION:70 y.o. never smoker with h/o PONV, sleep apnea, HTN, nonobstructive CAD, atrial flutter s/p ablation 2012, right knee OA scheduled for above procedure 03/01/2021 with Dr. Marchia Bond.   Clearance from EP received which states pt is optimized for surgery and can hold Eliquis for 3 days prior to procedure.   Clearance from PCP received which states pt is optimized for surgery.   Anticipate pt can proceed with planned procedure barring acute status change.   VS: BP (!) 145/78   Pulse 64   Temp 36.8 C (Oral)   Resp 16   Ht 6\' 1"  (1.854 m)   Wt 113.9 kg   SpO2 98%   BMI 33.12 kg/m   PROVIDERS: Thalia Party is PCP   Sudie Grumbling, MD is EP LABS: Labs reviewed: Acceptable for surgery. (all labs ordered are listed, but only abnormal results are displayed)  Labs Reviewed  HEMOGLOBIN A1C - Abnormal; Notable for the following components:      Result Value   Hgb A1c MFr Bld 5.7 (*)    All other components within normal limits  SURGICAL PCR SCREEN  BASIC METABOLIC PANEL  CBC     IMAGES:   EKG: 01/19/2021 (tracing requested) Rate 68 bpm  Sinus rhythm First degree A-V block  Otherwise normal ECG  When compared with ECG of 26-Nov-2020 12:08,   CV:  Past Medical History:  Diagnosis Date   Arthritis    Cancer ([place])    Squamous and basal cell skin cancer   Complication of anesthesia    Coronary artery disease    Dysrhythmia    afib   Hypertension    PONV (postoperative nausea and vomiting)    1997   Primary localized osteoarthritis of left knee 01/15/2018   Sleep apnea     Past Surgical History:  Procedure Laterality Date   ANTERIOR CERVICAL DECOMP/DISCECTOMY FUSION     BACK SURGERY     ACDF, lumbar  laminectomy 2010   [place] AND ABLATION     2013   KNEE ARTHROSCOPY Left 2018   KNEE ARTHROSCOPY W/ OATS PROCEDURE Left 1997   PARTIAL KNEE ARTHROPLASTY Left 01/15/2018   Procedure: LEFT UNICOMPARTMENTAL KNEE;  Surgeon: Marchia Bond, MD;  Location: [place];  Service: Orthopedics;  Laterality: Left;   ROTATOR CUFF REPAIR Right     MEDICATIONS:  apixaban (ELIQUIS) 5 MG TABS tablet   aspirin EC 325 MG tablet   bisoprolol-hydrochlorothiazide (ZIAC) 10-6.25 MG tablet   ezetimibe (ZETIA) 10 MG tablet   rosuvastatin (CRESTOR) 20 MG tablet   Semaglutide, 1 MG/DOSE, (OZEMPIC, 1 MG/DOSE,) 2 MG/1.5ML SOPN   No current facility-administered medications for this encounter.   Konrad Felix Schroth, PA-C [facility] Testing [phone]

## 2021-02-25 ENCOUNTER — Other Ambulatory Visit: Payer: Self-pay | Admitting: Orthopedic Surgery

## 2021-02-26 LAB — SARS CORONAVIRUS 2 (TAT 6-24 HRS): SARS Coronavirus 2: NEGATIVE

## 2021-03-01 ENCOUNTER — Observation Stay (HOSPITAL_COMMUNITY): Payer: Medicare PPO

## 2021-03-01 ENCOUNTER — Observation Stay (HOSPITAL_COMMUNITY)
Admission: RE | Admit: 2021-03-01 | Discharge: 2021-03-02 | Disposition: A | Discharge status: Home / Self Care | Payer: Medicare PPO | Source: Ambulatory Visit | Attending: Orthopedic Surgery | Admitting: Orthopedic Surgery

## 2021-03-01 ENCOUNTER — Encounter (HOSPITAL_COMMUNITY)
Admission: RE | Disposition: A | Discharge status: Home / Self Care | Payer: Self-pay | Source: Ambulatory Visit | Attending: Orthopedic Surgery

## 2021-03-01 ENCOUNTER — Ambulatory Visit (HOSPITAL_COMMUNITY): Payer: Medicare PPO | Admitting: Anesthesiology

## 2021-03-01 ENCOUNTER — Ambulatory Visit (HOSPITAL_COMMUNITY): Payer: Medicare PPO | Admitting: Physician Assistant

## 2021-03-01 ENCOUNTER — Encounter (HOSPITAL_COMMUNITY): Payer: Self-pay | Admitting: Orthopedic Surgery

## 2021-03-01 ENCOUNTER — Other Ambulatory Visit: Payer: Self-pay

## 2021-03-01 DIAGNOSIS — I10 Essential (primary) hypertension: Secondary | ICD-10-CM | POA: Insufficient documentation

## 2021-03-01 DIAGNOSIS — M1711 Unilateral primary osteoarthritis, right knee: Principal | ICD-10-CM | POA: Insufficient documentation

## 2021-03-01 DIAGNOSIS — Z79899 Other long term (current) drug therapy: Secondary | ICD-10-CM | POA: Diagnosis not present

## 2021-03-01 DIAGNOSIS — Z96652 Presence of left artificial knee joint: Secondary | ICD-10-CM | POA: Insufficient documentation

## 2021-03-01 DIAGNOSIS — Z85828 Personal history of other malignant neoplasm of skin: Secondary | ICD-10-CM | POA: Insufficient documentation

## 2021-03-01 DIAGNOSIS — I2510 Atherosclerotic heart disease of native coronary artery without angina pectoris: Secondary | ICD-10-CM | POA: Diagnosis not present

## 2021-03-01 DIAGNOSIS — Z96651 Presence of right artificial knee joint: Secondary | ICD-10-CM

## 2021-03-01 HISTORY — PX: TOTAL KNEE ARTHROPLASTY: SHX125

## 2021-03-01 LAB — GLUCOSE, CAPILLARY: Glucose-Capillary: 75 mg/dL (ref 70–99)

## 2021-03-01 SURGERY — ARTHROPLASTY, KNEE, TOTAL
Anesthesia: Regional | Site: Knee | Laterality: Right

## 2021-03-01 MED ORDER — DEXAMETHASONE SODIUM PHOSPHATE 10 MG/ML IJ SOLN
10.0000 mg | Freq: Once | INTRAMUSCULAR | Status: AC
  Administered 2021-03-02: 10 mg via INTRAVENOUS
  Filled 2021-03-01: qty 1

## 2021-03-01 MED ORDER — ONDANSETRON HCL 4 MG/2ML IJ SOLN: 4.0000 mg | Freq: Once | INTRAMUSCULAR | Status: DC | PRN

## 2021-03-01 MED ORDER — ACETAMINOPHEN 500 MG PO TABS: 1000.0000 mg | ORAL_TABLET | Freq: Once | ORAL | Status: DC

## 2021-03-01 MED ORDER — FLEET ENEMA 7-19 GM/118ML RE ENEM: 1.0000 | ENEMA | Freq: Once | RECTAL | Status: DC | PRN

## 2021-03-01 MED ORDER — FENTANYL CITRATE PF 50 MCG/ML IJ SOSY
50.0000 ug | PREFILLED_SYRINGE | INTRAMUSCULAR | Status: DC
  Filled 2021-03-01: qty 2

## 2021-03-01 MED ORDER — LACTATED RINGERS IV SOLN: INTRAVENOUS | Status: DC

## 2021-03-01 MED ORDER — ACETAMINOPHEN 325 MG PO TABS: 325.0000 mg | ORAL_TABLET | Freq: Four times a day (QID) | ORAL | Status: DC | PRN

## 2021-03-01 MED ORDER — METHOCARBAMOL 500 MG IVPB - SIMPLE MED
INTRAVENOUS | Status: AC
  Filled 2021-03-01: qty 50

## 2021-03-01 MED ORDER — APIXABAN 5 MG PO TABS
5.0000 mg | ORAL_TABLET | Freq: Two times a day (BID) | ORAL | Status: DC
  Administered 2021-03-02: 5 mg via ORAL
  Filled 2021-03-01: qty 1

## 2021-03-01 MED ORDER — METHOCARBAMOL 500 MG PO TABS: 500.0000 mg | ORAL_TABLET | Freq: Four times a day (QID) | ORAL | Status: DC | PRN

## 2021-03-01 MED ORDER — KETOROLAC TROMETHAMINE 30 MG/ML IJ SOLN
INTRAMUSCULAR | Status: DC | PRN
  Administered 2021-03-01: 30 mg via INTRA_ARTICULAR

## 2021-03-01 MED ORDER — 0.9 % SODIUM CHLORIDE (POUR BTL) OPTIME
TOPICAL | Status: DC | PRN
  Administered 2021-03-01: 1000 mL

## 2021-03-01 MED ORDER — OXYCODONE HCL 5 MG PO TABS
5.0000 mg | ORAL_TABLET | ORAL | Status: DC | PRN
Start: 2021-03-01 — End: 2021-03-02
  Administered 2021-03-01 – 2021-03-02 (×4): 5 mg via ORAL
  Filled 2021-03-01 (×3): qty 1

## 2021-03-01 MED ORDER — FENTANYL CITRATE (PF) 250 MCG/5ML IJ SOLN
INTRAMUSCULAR | Status: DC | PRN
  Administered 2021-03-01 (×8): 25 ug via INTRAVENOUS

## 2021-03-01 MED ORDER — POVIDONE-IODINE 10 % EX SWAB
2.0000 "application " | Freq: Once | CUTANEOUS | Status: AC
  Administered 2021-03-01: 2 via TOPICAL

## 2021-03-01 MED ORDER — ONDANSETRON HCL 4 MG PO TABS: 4.0000 mg | ORAL_TABLET | Freq: Three times a day (TID) | ORAL | 0 refills | Status: AC | PRN

## 2021-03-01 MED ORDER — DIPHENHYDRAMINE HCL 12.5 MG/5ML PO ELIX: 12.5000 mg | ORAL_SOLUTION | ORAL | Status: DC | PRN

## 2021-03-01 MED ORDER — DEXAMETHASONE SODIUM PHOSPHATE 10 MG/ML IJ SOLN
INTRAMUSCULAR | Status: DC | PRN
  Administered 2021-03-01: 10 mg via INTRAVENOUS

## 2021-03-01 MED ORDER — BUPIVACAINE HCL (PF) 0.25 % IJ SOLN
INTRAMUSCULAR | Status: AC
  Filled 2021-03-01: qty 30

## 2021-03-01 MED ORDER — ORAL CARE MOUTH RINSE: 15.0000 mL | Freq: Once | OROMUCOSAL | Status: AC

## 2021-03-01 MED ORDER — BUPIVACAINE-EPINEPHRINE (PF) 0.5% -1:200000 IJ SOLN
INTRAMUSCULAR | Status: DC | PRN
  Administered 2021-03-01: 30 mL via PERINEURAL

## 2021-03-01 MED ORDER — FENTANYL CITRATE PF 50 MCG/ML IJ SOSY
25.0000 ug | PREFILLED_SYRINGE | INTRAMUSCULAR | Status: DC | PRN
  Administered 2021-03-01 (×3): 50 ug via INTRAVENOUS

## 2021-03-01 MED ORDER — EZETIMIBE 10 MG PO TABS
10.0000 mg | ORAL_TABLET | Freq: Every day | ORAL | Status: DC
  Administered 2021-03-02: 10 mg via ORAL
  Filled 2021-03-01: qty 1

## 2021-03-01 MED ORDER — ACETAMINOPHEN 500 MG PO TABS
1000.0000 mg | ORAL_TABLET | Freq: Once | ORAL | Status: AC
  Administered 2021-03-01: 1000 mg via ORAL
  Filled 2021-03-01: qty 2

## 2021-03-01 MED ORDER — LIDOCAINE 2% (20 MG/ML) 5 ML SYRINGE
INTRAMUSCULAR | Status: DC | PRN
  Administered 2021-03-01: 60 mg via INTRAVENOUS
  Administered 2021-03-01: 40 mg via INTRAVENOUS

## 2021-03-01 MED ORDER — ONDANSETRON HCL 4 MG/2ML IJ SOLN
INTRAMUSCULAR | Status: DC | PRN
  Administered 2021-03-01: 4 mg via INTRAVENOUS

## 2021-03-01 MED ORDER — MIDAZOLAM HCL 2 MG/2ML IJ SOLN
1.0000 mg | INTRAMUSCULAR | Status: AC
  Administered 2021-03-01: 1 mg via INTRAVENOUS
  Filled 2021-03-01: qty 2

## 2021-03-01 MED ORDER — TRANEXAMIC ACID-NACL 1000-0.7 MG/100ML-% IV SOLN
1000.0000 mg | INTRAVENOUS | Status: AC
  Administered 2021-03-01: 1000 mg via INTRAVENOUS
  Filled 2021-03-01: qty 100

## 2021-03-01 MED ORDER — OXYCODONE HCL 5 MG PO TABS: 5.0000 mg | ORAL_TABLET | ORAL | 0 refills | Status: AC | PRN

## 2021-03-01 MED ORDER — PROPOFOL 10 MG/ML IV BOLUS
INTRAVENOUS | Status: AC
  Filled 2021-03-01: qty 40

## 2021-03-01 MED ORDER — FENTANYL CITRATE PF 50 MCG/ML IJ SOSY
PREFILLED_SYRINGE | INTRAMUSCULAR | Status: AC
  Filled 2021-03-01: qty 2

## 2021-03-01 MED ORDER — ACETAMINOPHEN 500 MG PO TABS
1000.0000 mg | ORAL_TABLET | Freq: Four times a day (QID) | ORAL | Status: AC
  Administered 2021-03-01 – 2021-03-02 (×4): 1000 mg via ORAL
  Filled 2021-03-01 (×4): qty 2

## 2021-03-01 MED ORDER — MENTHOL 3 MG MT LOZG: 1.0000 | LOZENGE | OROMUCOSAL | Status: DC | PRN

## 2021-03-01 MED ORDER — BISOPROLOL-HYDROCHLOROTHIAZIDE 10-6.25 MG PO TABS
1.0000 | ORAL_TABLET | Freq: Every day | ORAL | Status: DC
  Administered 2021-03-02: 1 via ORAL
  Filled 2021-03-01: qty 1

## 2021-03-01 MED ORDER — CEFAZOLIN SODIUM-DEXTROSE 2-4 GM/100ML-% IV SOLN
2.0000 g | INTRAVENOUS | Status: AC
  Administered 2021-03-01: 2 g via INTRAVENOUS
  Filled 2021-03-01: qty 100

## 2021-03-01 MED ORDER — METOCLOPRAMIDE HCL 5 MG PO TABS: 5.0000 mg | ORAL_TABLET | Freq: Three times a day (TID) | ORAL | Status: DC | PRN

## 2021-03-01 MED ORDER — ROSUVASTATIN CALCIUM 10 MG PO TABS
10.0000 mg | ORAL_TABLET | Freq: Every day | ORAL | Status: DC
  Administered 2021-03-02: 10 mg via ORAL
  Filled 2021-03-01: qty 1

## 2021-03-01 MED ORDER — POTASSIUM CHLORIDE IN NACL 20-0.45 MEQ/L-% IV SOLN
INTRAVENOUS | Status: DC
  Filled 2021-03-01 (×2): qty 1000

## 2021-03-01 MED ORDER — PROPOFOL 10 MG/ML IV BOLUS
INTRAVENOUS | Status: DC | PRN
  Administered 2021-03-01: 200 mg via INTRAVENOUS

## 2021-03-01 MED ORDER — KETOROLAC TROMETHAMINE 30 MG/ML IJ SOLN
INTRAMUSCULAR | Status: AC
  Filled 2021-03-01: qty 1

## 2021-03-01 MED ORDER — METOCLOPRAMIDE HCL 5 MG/ML IJ SOLN: 5.0000 mg | Freq: Three times a day (TID) | INTRAMUSCULAR | Status: DC | PRN

## 2021-03-01 MED ORDER — METHOCARBAMOL 500 MG IVPB - SIMPLE MED
500.0000 mg | Freq: Four times a day (QID) | INTRAVENOUS | Status: DC | PRN
  Administered 2021-03-01: 500 mg via INTRAVENOUS
  Filled 2021-03-01: qty 50

## 2021-03-01 MED ORDER — BISACODYL 10 MG RE SUPP: 10.0000 mg | Freq: Every day | RECTAL | Status: DC | PRN

## 2021-03-01 MED ORDER — CEFAZOLIN SODIUM-DEXTROSE 2-4 GM/100ML-% IV SOLN
2.0000 g | Freq: Four times a day (QID) | INTRAVENOUS | Status: AC
  Administered 2021-03-01 (×2): 2 g via INTRAVENOUS
  Filled 2021-03-01 (×2): qty 100

## 2021-03-01 MED ORDER — FENTANYL CITRATE (PF) 100 MCG/2ML IJ SOLN
INTRAMUSCULAR | Status: AC
  Filled 2021-03-01: qty 2

## 2021-03-01 MED ORDER — ONDANSETRON HCL 4 MG PO TABS: 4.0000 mg | ORAL_TABLET | Freq: Four times a day (QID) | ORAL | Status: DC | PRN

## 2021-03-01 MED ORDER — TRANEXAMIC ACID-NACL 1000-0.7 MG/100ML-% IV SOLN
1000.0000 mg | Freq: Once | INTRAVENOUS | Status: AC
  Administered 2021-03-01: 1000 mg via INTRAVENOUS
  Filled 2021-03-01: qty 100

## 2021-03-01 MED ORDER — ONDANSETRON HCL 4 MG/2ML IJ SOLN: 4.0000 mg | Freq: Four times a day (QID) | INTRAMUSCULAR | Status: DC | PRN

## 2021-03-01 MED ORDER — ONDANSETRON HCL 4 MG/2ML IJ SOLN
INTRAMUSCULAR | Status: AC
  Filled 2021-03-01: qty 2

## 2021-03-01 MED ORDER — SENNA-DOCUSATE SODIUM 8.6-50 MG PO TABS: 2.0000 | ORAL_TABLET | Freq: Every day | ORAL | 1 refills | Status: AC

## 2021-03-01 MED ORDER — AMISULPRIDE (ANTIEMETIC) 5 MG/2ML IV SOLN: 10.0000 mg | Freq: Once | INTRAVENOUS | Status: DC | PRN

## 2021-03-01 MED ORDER — POVIDONE-IODINE 7.5 % EX SOLN: Freq: Once | CUTANEOUS | Status: DC

## 2021-03-01 MED ORDER — OXYCODONE HCL 5 MG PO TABS: 10.0000 mg | ORAL_TABLET | ORAL | Status: DC | PRN

## 2021-03-01 MED ORDER — SODIUM CHLORIDE 0.9 % IR SOLN
Status: DC | PRN
  Administered 2021-03-01: 3000 mL

## 2021-03-01 MED ORDER — FENTANYL CITRATE PF 50 MCG/ML IJ SOSY
PREFILLED_SYRINGE | INTRAMUSCULAR | Status: AC
  Filled 2021-03-01: qty 1

## 2021-03-01 MED ORDER — OXYCODONE HCL 5 MG PO TABS
ORAL_TABLET | ORAL | Status: AC
  Filled 2021-03-01: qty 1

## 2021-03-01 MED ORDER — PHENOL 1.4 % MT LIQD: 1.0000 | OROMUCOSAL | Status: DC | PRN

## 2021-03-01 MED ORDER — CHLORHEXIDINE GLUCONATE 0.12 % MT SOLN
15.0000 mL | Freq: Once | OROMUCOSAL | Status: AC
  Administered 2021-03-01: 15 mL via OROMUCOSAL

## 2021-03-01 MED ORDER — ALUM & MAG HYDROXIDE-SIMETH 200-200-20 MG/5ML PO SUSP: 30.0000 mL | ORAL | Status: DC | PRN

## 2021-03-01 MED ORDER — BUPIVACAINE HCL 0.25 % IJ SOLN
INTRAMUSCULAR | Status: DC | PRN
  Administered 2021-03-01: 30 mL

## 2021-03-01 MED ORDER — POLYETHYLENE GLYCOL 3350 17 G PO PACK: 17.0000 g | PACK | Freq: Every day | ORAL | Status: DC | PRN

## 2021-03-01 MED ORDER — WATER FOR IRRIGATION, STERILE IR SOLN
Status: DC | PRN
  Administered 2021-03-01: 2000 mL

## 2021-03-01 MED ORDER — DOCUSATE SODIUM 100 MG PO CAPS
100.0000 mg | ORAL_CAPSULE | Freq: Two times a day (BID) | ORAL | Status: DC
  Administered 2021-03-01 – 2021-03-02 (×2): 100 mg via ORAL
  Filled 2021-03-01 (×2): qty 1

## 2021-03-01 MED ORDER — HYDROMORPHONE HCL 1 MG/ML IJ SOLN: 0.5000 mg | INTRAMUSCULAR | Status: DC | PRN

## 2021-03-01 MED ORDER — DEXAMETHASONE SODIUM PHOSPHATE 10 MG/ML IJ SOLN
INTRAMUSCULAR | Status: AC
  Filled 2021-03-01: qty 1

## 2021-03-01 MED ORDER — LIDOCAINE HCL (PF) 2 % IJ SOLN
INTRAMUSCULAR | Status: AC
  Filled 2021-03-01: qty 5

## 2021-03-01 SURGICAL SUPPLY — 53 items
ATTUNE MED DOME PAT 41 KNEE (Knees) ×2 IMPLANT
ATTUNE PS FEM RT SZ 8 CEM KNEE (Femur) ×2 IMPLANT
BAG COUNTER SPONGE SURGICOUNT (BAG) IMPLANT
BAG ZIPLOCK 12X15 (MISCELLANEOUS) IMPLANT
BASEPLATE TIB CMT FB PCKT SZ 8 (Knees) ×2 IMPLANT
BLADE SAG 18X100X1.27 (BLADE) ×2 IMPLANT
BLADE SAW SGTL 11.0X1.19X90.0M (BLADE) ×2 IMPLANT
BLADE SAW SGTL 13X75X1.27 (BLADE) ×2 IMPLANT
BLADE SURG 15 STRL LF DISP TIS (BLADE) ×1 IMPLANT
BLADE SURG 15 STRL SS (BLADE) ×1
BNDG ELASTIC 6X10 VLCR STRL LF (GAUZE/BANDAGES/DRESSINGS) ×2 IMPLANT
BOWL SMART MIX CTS (DISPOSABLE) ×2 IMPLANT
CEMENT HV SMART SET (Cement) ×4 IMPLANT
CLSR STERI-STRIP ANTIMIC 1/2X4 (GAUZE/BANDAGES/DRESSINGS) ×2 IMPLANT
COVER SURGICAL LIGHT HANDLE (MISCELLANEOUS) ×2 IMPLANT
CUFF TOURN SGL QUICK 34 (TOURNIQUET CUFF) ×1
CUFF TRNQT CYL 34X4.125X (TOURNIQUET CUFF) ×1 IMPLANT
DECANTER SPIKE VIAL GLASS SM (MISCELLANEOUS) IMPLANT
DRAPE SHEET LG 3/4 BI-LAMINATE (DRAPES) ×2 IMPLANT
DRAPE U-SHAPE 47X51 STRL (DRAPES) ×2 IMPLANT
DRSG AQUACEL AG ADV 3.5X10 (GAUZE/BANDAGES/DRESSINGS) ×2 IMPLANT
DRSG MEPILEX BORDER 4X12 (GAUZE/BANDAGES/DRESSINGS) ×2 IMPLANT
DRSG PAD ABDOMINAL 8X10 ST (GAUZE/BANDAGES/DRESSINGS) ×4 IMPLANT
DURAPREP 26ML APPLICATOR (WOUND CARE) ×4 IMPLANT
ELECT REM PT RETURN 15FT ADLT (MISCELLANEOUS) ×2 IMPLANT
GLOVE SRG 8 PF TXTR STRL LF DI (GLOVE) ×1 IMPLANT
GLOVE SURG ENC MOIS LTX SZ6.5 (GLOVE) ×2 IMPLANT
GLOVE SURG ENC MOIS LTX SZ7.5 (GLOVE) ×2 IMPLANT
GLOVE SURG UNDER POLY LF SZ7 (GLOVE) ×2 IMPLANT
GLOVE SURG UNDER POLY LF SZ8 (GLOVE) ×1
GOWN STRL REUS W/ TWL LRG LVL3 (GOWN DISPOSABLE) ×2 IMPLANT
GOWN STRL REUS W/TWL LRG LVL3 (GOWN DISPOSABLE) ×2
HANDPIECE INTERPULSE COAX TIP (DISPOSABLE) ×1
HOLDER FOLEY CATH W/STRAP (MISCELLANEOUS) IMPLANT
HOOD PEEL AWAY FLYTE STAYCOOL (MISCELLANEOUS) ×6 IMPLANT
IMMOBILIZER KNEE 20 (SOFTGOODS) ×2
IMMOBILIZER KNEE 20 THIGH 36 (SOFTGOODS) ×1 IMPLANT
INSERT TIBIA FIXED BEARING SZ8 (Insert) ×2 IMPLANT
KIT TURNOVER KIT A (KITS) ×2 IMPLANT
MANIFOLD NEPTUNE II (INSTRUMENTS) ×2 IMPLANT
NS IRRIG 1000ML POUR BTL (IV SOLUTION) ×2 IMPLANT
PACK TOTAL KNEE CUSTOM (KITS) ×2 IMPLANT
PROTECTOR NERVE ULNAR (MISCELLANEOUS) ×2 IMPLANT
SET HNDPC FAN SPRY TIP SCT (DISPOSABLE) ×1 IMPLANT
SET PAD KNEE POSITIONER (MISCELLANEOUS) ×2 IMPLANT
SUT VIC AB 1 CT1 36 (SUTURE) ×4 IMPLANT
SUT VIC AB 2-0 CT1 27 (SUTURE) ×2
SUT VIC AB 2-0 CT1 TAPERPNT 27 (SUTURE) ×2 IMPLANT
SUT VIC AB 3-0 SH 8-18 (SUTURE) ×2 IMPLANT
TRAY FOLEY MTR SLVR 16FR STAT (SET/KITS/TRAYS/PACK) ×2 IMPLANT
TUBE SUCTION HIGH CAP CLEAR NV (SUCTIONS) ×2 IMPLANT
WATER STERILE IRR 1000ML POUR (IV SOLUTION) ×4 IMPLANT
WRAP KNEE MAXI GEL POST OP (GAUZE/BANDAGES/DRESSINGS) ×2 IMPLANT

## 2021-03-01 NOTE — Op Note (Signed)
DATE OF SURGERY:  03/01/2021 TIME: 12:01 PM  PATIENT NAME:  Terrence Gross   AGE: 69 y.o.    PRE-OPERATIVE DIAGNOSIS: Right knee primary localized osteoarthritis  POST-OPERATIVE DIAGNOSIS:  Same  PROCEDURE: RIGHT total Knee Arthroplasty  SURGEON:  Johnny Bridge, MD   ASSISTANT:  Merlene Pulling, PA-C, present and scrubbed throughout the case, critical for assistance with exposure, retraction, instrumentation, and closure.   OPERATIVE IMPLANTS: Depuy Attune size 8 posterior Stabilized Femur, with a size 8 fixed Bearing Tibia, 5 polyethylene insert with a 41 medialized oval dome polyethylene patella.  PREOPERATIVE INDICATIONS:  Terrence Gross is a 70 y.o. year old male with end stage bone on bone degenerative arthritis of the knee who failed conservative treatment, including injections, antiinflammatories, activity modification, and assistive devices, and had significant impairment of their activities of daily living, and elected for Total Knee Arthroplasty.   The risks, benefits, and alternatives were discussed at length including but not limited to the risks of infection, bleeding, nerve injury, stiffness, blood clots, the need for revision surgery, cardiopulmonary complications, among others, and they were willing to proceed.  OPERATIVE FINDINGS AND UNIQUE ASPECTS OF THE CASE: He had extensive chondral loss on the medial side, and also had areas of full-thickness chondral loss on the lateral side.  The patella tracked with a 1 thumb support technique at the completion of the case, the patella initially measured 27 before the cut, and then 16 after the cut.  ESTIMATED BLOOD LOSS: 100  OPERATIVE DESCRIPTION:  The patient was brought to the operative room and placed in a supine position.  Anesthesia was administered.  IV antibiotics were given.  The lower extremity was prepped and draped in the usual sterile fashion.  Time out was performed.  The leg was elevated and  exsanguinated and the tourniquet was inflated.  Anterior quadriceps tendon splitting approach was performed.  The patella was everted and osteophytes were removed.  The anterior horn of the medial and lateral meniscus was removed.   The patella was then measured, and cut with the saw.  The thickness before the cut was 27 and after the cut was 16.  A metal shield was used to protect the patella throughout the case.    The distal femur was opened with the drill and the intramedullary distal femoral cutting jig was utilized, set at 5 degrees resecting 10 mm off the distal femur.  Care was taken to protect the collateral ligaments.  Then the extramedullary tibial cutting jig was utilized making the appropriate cut using the anterior tibial crest as a reference building in appropriate posterior slope.  Care was taken during the cut to protect the medial and collateral ligaments.  The proximal tibia was removed along with the posterior horns of the menisci.  The PCL was sacrificed.    The extensor gap was measured and found to have adequate resection, measuring to a size 5.    The distal femoral sizing jig was applied, taking care to avoid notching.  This was set at 3 degrees of external rotation.  Then the 4-in-1 cutting jig was applied and the anterior and posterior femur was cut, along with the chamfer cuts.  All posterior osteophytes were removed.  The flexion gap was then measured and was symmetric with the extension gap.  I completed the distal femoral preparation using the appropriate jig to prepare the box.  The proximal tibia sized and prepared accordingly with the reamer and the punch, and then all  components were trialed with the poly insert.  The knee was found to have excellent balance and full motion.    The above named components were then cemented into place and all excess cement was removed.  The real polyethylene implant was placed.  After the cement had cured I released the tourniquet  and confirmed excellent hemostasis with no major posterior vessel injury.    The knee was easily taken through a range of motion and the patella tracked well and the knee irrigated copiously and the parapatellar and subcutaneous tissue closed with vicryl, and monocryl with steri strips for the skin.  The wounds were injected with marcaine, and dressed with sterile gauze and the patient was awakened and returned to the PACU in stable and satisfactory condition.  There were no complications.  Total tourniquet time was 63 minutes.

## 2021-03-01 NOTE — Transfer of Care (Signed)
Immediate Anesthesia Transfer of Care Note  Patient: Terrence Gross  Procedure(s) Performed: TOTAL KNEE ARTHROPLASTY (Right: Knee)  Patient Location: PACU  Anesthesia Type:GA combined with regional for post-op pain  Level of Consciousness: sedated, patient cooperative and responds to stimulation  Airway & Oxygen Therapy: Patient Spontanous Breathing and Patient connected to nasal cannula oxygen  Post-op Assessment: Report given to RN and Post -op Vital signs reviewed and stable  Post vital signs: Reviewed and stable  Last Vitals:  Vitals Value Taken Time  BP 153/85 03/01/21 1245  Temp    Pulse 74 03/01/21 1245  Resp 19 03/01/21 1246  SpO2 93 % 03/01/21 1245  Vitals shown include unvalidated device data.  Last Pain:  Vitals:   03/01/21 0754  TempSrc: Oral         Complications: No notable events documented.

## 2021-03-01 NOTE — Progress Notes (Signed)
AssistedDr. Ellender with right, ultrasound guided, adductor canal block. Side rails up, monitors on throughout procedure. See vital signs in flow sheet. Tolerated Procedure well.  

## 2021-03-01 NOTE — Discharge Instructions (Signed)

## 2021-03-01 NOTE — Anesthesia Procedure Notes (Signed)
Anesthesia Regional Block: Adductor canal block   Pre-Anesthetic Checklist: , timeout performed,  Correct Patient, Correct Site, Correct Laterality,  Correct Procedure,, site marked,  Risks and benefits discussed,  Surgical consent,  Pre-op evaluation,  At surgeon's request and post-op pain management  Laterality: Right  Prep: chloraprep       Needles:  Injection technique: Single-shot  Needle Type: Echogenic Stimulator Needle     Needle Length: 9cm  Needle Gauge: 21     Additional Needles:   Procedures:,,,, ultrasound used (permanent image in chart),,    Narrative:  Start time: 03/01/2021 9:20 AM End time: 03/01/2021 9:30 AM Injection made incrementally with aspirations every 5 mL.  Performed by: Personally  Anesthesiologist: Murvin Natal, MD  Additional Notes: Functioning IV was confirmed and monitors were applied. A time-out was performed. Hand hygiene and sterile gloves were used. The thigh was placed in a frog-leg position and prepped in a sterile fashion. A 10mm 21ga Arrow echogenic stimulator needle was placed using ultrasound guidance.  Negative aspiration and negative test dose prior to incremental administration of local anesthetic. The patient tolerated the procedure well.

## 2021-03-01 NOTE — H&P (Signed)
PREOPERATIVE H&P  Chief Complaint: Right knee pain  HPI: Terrence Gross is a 70 y.o. male who presents for preoperative history and physical with a diagnosis of right knee primary localized osteoarthritis. Symptoms are rated as moderate to severe, and have been worsening.  This is significantly impairing activities of daily living.  He has elected for surgical management.  He has had previous right knee arthroscopy, with meniscectomy, but failed to improve clinically.  He has failed injections activity modification exercises and anti-inflammatories.  He is elected for total knee replacement.  Past Medical History:  Diagnosis Date   Arthritis    Cancer ([place])    Squamous and basal cell skin cancer   Complication of anesthesia    Coronary artery disease    Dysrhythmia    afib   Hypertension    PONV (postoperative nausea and vomiting)    1997   Primary localized osteoarthritis of left knee 01/15/2018   Sleep apnea    Past Surgical History:  Procedure Laterality Date   ANTERIOR CERVICAL DECOMP/DISCECTOMY FUSION     BACK SURGERY     ACDF, lumbar laminectomy 2010   [place] AND ABLATION     2013   KNEE ARTHROSCOPY Left 2018   KNEE ARTHROSCOPY W/ OATS PROCEDURE Left 1997   PARTIAL KNEE ARTHROPLASTY Left 01/15/2018   Procedure: LEFT UNICOMPARTMENTAL KNEE;  Surgeon: Marchia Bond, MD;  Location: [place] [facility];  Service: Orthopedics;  Laterality: Left;   ROTATOR CUFF REPAIR Right    Social History   Socioeconomic History   Marital status: Married    Spouse name: Not on file   Number of children: Not on file   Years of education: Not on file   Highest education level: Not on file  Occupational History   Not on file  Tobacco Use   Smoking status: Never   Smokeless tobacco: Never  Vaping Use   Vaping Use: Never used  Substance and Sexual Activity   Alcohol use: Never    Comment: 1--2 drinks a day   Drug use: Never   Sexual activity: Not on file  Other  Topics Concern   Not on file  Social History Narrative   Not on file   Social Determinants of Health   Financial Resource Strain: Not on file  Food Insecurity: Not on file  Transportation Needs: Not on file  Physical Activity: Not on file  Stress: Not on file  Social Connections: Not on file   History reviewed. No pertinent family history. Allergies  Allergen Reactions   Tape Rash and Other (See Comments)    BLISTERS [PAPER TAPE OK TO USE]   Other Rash    SILK TAPE   Prior to Admission medications   Medication Sig Start Date End Date Taking? Authorizing Provider  apixaban (ELIQUIS) 5 MG TABS tablet Take 5 mg by mouth 2 (two) times daily.   Yes [provider]  bisoprolol-hydrochlorothiazide (ZIAC) 10-6.25 MG tablet Take 1 tablet by mouth daily.   Yes [provider]  ezetimibe (ZETIA) 10 MG tablet Take 10 mg by mouth daily.   Yes [provider]  rosuvastatin (CRESTOR) 20 MG tablet Take 10 mg by mouth daily.   Yes [provider]  Semaglutide, 1 MG/DOSE, (OZEMPIC, 1 MG/DOSE,) 2 MG/1.5ML SOPN Inject 1 mg into the skin every Thursday.   Yes [provider]  aspirin EC 325 MG tablet Take 1 tablet (325 mg total) by mouth daily. Patient not taking: No sig reported  01/15/18   Marchia Bond, MD     Positive ROS: All other systems have been reviewed and were otherwise negative with the exception of those mentioned in the HPI and as above.  Physical Exam: General: Alert, no acute distress Cardiovascular: No pedal edema Respiratory: No cyanosis, no use of accessory musculature GI: No organomegaly, abdomen is soft and non-tender Skin: No lesions in the area of chief complaint Neurologic: Sensation intact distally Psychiatric: Patient is competent for consent with normal mood and affect Lymphatic: No axillary or cervical lymphadenopathy  MUSCULOSKELETAL: Right knee has mild crepitance, with positive effusion, painful arc of  motion.  Assessment: Right knee primary localized osteoarthritis   Plan: Plan for Procedure(s): RIGHT TOTAL KNEE ARTHROPLASTY  The risks benefits and alternatives were discussed with the patient including but not limited to the risks of nonoperative treatment, versus surgical intervention including infection, bleeding, nerve injury,  blood clots, cardiopulmonary complications, morbidity, mortality, among others, and they were willing to proceed.    Patient's anticipated LOS is less than 2 midnights, meeting these requirements: - Younger than 58 - Lives within 1 hour of care - Has a competent adult at home to recover with post-op recover - NO history of  - Chronic pain requiring opiods  - Diabetes  - Coronary Artery Disease  - Heart failure  - Heart attack  - Stroke  - DVT/VTE  - Cardiac arrhythmia  - Respiratory Failure/COPD  - Renal failure  - Anemia  - Advanced Liver disease      Johnny Bridge, MD Cell [phone]   03/01/2021 9:19 AM

## 2021-03-01 NOTE — Anesthesia Postprocedure Evaluation (Addendum)
Anesthesia Post Note  Patient: Terrence Gross  Procedure(s) Performed: TOTAL KNEE ARTHROPLASTY (Right: Knee)     Patient location during evaluation: PACU Anesthesia Type: General Level of consciousness: oriented and awake and alert Pain management: pain level controlled Vital Signs Assessment: post-procedure vital signs reviewed and stable Respiratory status: spontaneous breathing, respiratory function stable and nonlabored ventilation Cardiovascular status: blood pressure returned to baseline and stable Postop Assessment: no headache, no backache and no apparent nausea or vomiting Anesthetic complications: no   No notable events documented.  Last Vitals:  Vitals:   03/01/21 1500 03/01/21 1600  BP: (!) 148/84 (!) 154/94  Pulse: 64 64  Resp: 15 13  Temp:    SpO2: 97% 98%    Last Pain:  Vitals:   03/01/21 1600  TempSrc:   PainSc: Asleep                 Uriyah Raska A.

## 2021-03-01 NOTE — Anesthesia Procedure Notes (Signed)
Procedure Name: LMA Insertion Date/Time: 03/01/2021 10:27 AM Performed by: Myna Bright, CRNA Pre-anesthesia Checklist: Patient identified, Emergency Drugs available, Suction available and Patient being monitored Patient Re-evaluated:Patient Re-evaluated prior to induction Oxygen Delivery Method: Circle system utilized Preoxygenation: Pre-oxygenation with 100% oxygen Induction Type: IV induction Ventilation: Mask ventilation without difficulty LMA: LMA inserted LMA Size: 5.0 Number of attempts: 1 Placement Confirmation: positive ETCO2 and breath sounds checked- equal and bilateral Tube secured with: Tape Dental Injury: Teeth and Oropharynx as per pre-operative assessment

## 2021-03-02 DIAGNOSIS — M1711 Unilateral primary osteoarthritis, right knee: Secondary | ICD-10-CM | POA: Diagnosis not present

## 2021-03-02 LAB — BASIC METABOLIC PANEL
Anion gap: 7 (ref 5–15)
BUN: 20 mg/dL (ref 8–23)
CO2: 25 mmol/L (ref 22–32)
Calcium: 8.7 mg/dL — ABNORMAL LOW (ref 8.9–10.3)
Chloride: 107 mmol/L (ref 98–111)
Creatinine, Ser: 0.78 mg/dL (ref 0.61–1.24)
GFR, Estimated: >60 mL/min (ref >60)
Glucose, Bld: 142 mg/dL — ABNORMAL HIGH (ref 70–99)
Potassium: 4.4 mmol/L (ref 3.5–5.1)
Sodium: 139 mmol/L (ref 135–145)

## 2021-03-02 LAB — CBC
HCT: 39.6 % (ref 39.0–52.0)
Hemoglobin: 12.7 g/dL — ABNORMAL LOW (ref 13.0–17.0)
MCH: 29.5 pg (ref 26.0–34.0)
MCHC: 32.1 g/dL (ref 30.0–36.0)
MCV: 91.9 fL (ref 80.0–100.0)
Platelets: 180 10*3/uL (ref 150–400)
RBC: 4.31 MIL/uL (ref 4.22–5.81)
RDW: 14.3 % (ref 11.5–15.5)
WBC: 13.9 10*3/uL — ABNORMAL HIGH (ref 4.0–10.5)
nRBC: 0 % (ref 0.0–0.2)

## 2021-03-02 NOTE — Progress Notes (Signed)
Subjective: 1 Day Post-Op s/p Procedure(s): TOTAL KNEE ARTHROPLASTY   Patient is alert, oriented, yes  Patient reports pain as mild at rest. He has not been up and moving yet, though states it feels good to move his knee. Denies chest pain, SOB, Calf pain. No nausea/vomiting. No other complaints.    Objective:  PE: VITALS:   Vitals:   03/01/21 1956 03/01/21 2324 03/02/21 0300 03/02/21 0631  BP: 133/73 132/73 121/61 120/64  Pulse: 83 85 76 74  Resp: 20 20 18 18   Temp: 98.1 F (36.7 C) (!) 97.4 F (36.3 C) 97.9 F (36.6 C) 98 F (36.7 C)  TempSrc: Oral Oral Oral Oral  SpO2: 99% 97% 95% 95%  Weight:      Height:        ABD soft Sensation intact distally Intact pulses distally Dorsiflexion/Plantar flexion intact Incision: dressing C/D/I Already at 5-50 degrees of active motion while supine this morning.   LABS  Results for orders placed or performed during the hospital encounter of 03/01/21 (from the past 24 hour(s))  CBC     Status: Abnormal   Collection Time: 03/02/21  3:30 AM  Result Value Ref Range   WBC 13.9 (H) 4.0 - 10.5 K/uL   RBC 4.31 4.22 - 5.81 MIL/uL   Hemoglobin 12.7 (L) 13.0 - 17.0 g/dL   HCT 39.6 39.0 - 52.0 %   MCV 91.9 80.0 - 100.0 fL   MCH 29.5 26.0 - 34.0 pg   MCHC 32.1 30.0 - 36.0 g/dL   RDW 14.3 11.5 - 15.5 %   Platelets 180 150 - 400 K/uL   nRBC 0.0 0.0 - 0.2 %  Basic metabolic panel     Status: Abnormal   Collection Time: 03/02/21  3:30 AM  Result Value Ref Range   Sodium 139 135 - 145 mmol/L   Potassium 4.4 3.5 - 5.1 mmol/L   Chloride 107 98 - 111 mmol/L   CO2 25 22 - 32 mmol/L   Glucose, Bld 142 (H) 70 - 99 mg/dL   BUN 20 8 - 23 mg/dL   Creatinine, Ser 0.78 0.61 - 1.24 mg/dL   Calcium 8.7 (L) 8.9 - 10.3 mg/dL   GFR, Estimated >60 >60 mL/min   Anion gap 7 5 - 15    DG Knee Right Port  Result Date: 03/01/2021 CLINICAL DATA:  Right knee replaced EXAM: PORTABLE RIGHT KNEE - 1-2 VIEW COMPARISON:  Radiograph 01/16/2020  FINDINGS: There is a new 3 component total knee arthroplasty without evidence of loosening or periprosthetic fracture. Expected soft tissue changes. Normal alignment. IMPRESSION: Right total knee arthroplasty without evidence of immediate hardware complication. Electronically Signed   By: Maurine Simmering M.D.   On: 03/01/2021 15:19    Assessment/Plan: Principal Problem:   Osteoarthritis of right knee Active Problems:   S/P total knee replacement, right  1 Day Post-Op s/p Procedure(s): TOTAL KNEE ARTHROPLASTY - overall doing well  Weightbearing: WBAT RLE Insicional and dressing care: Reinforce dressings as needed, may need to change prior to discharge if has has strikethrough after PT/ once eliquis is restarted this morning VTE prophylaxis: Home eliquis to restart today Pain control: continue current regimen Follow - up plan: 2 weeks with Dr. Mardelle Matte Dispo: pending PT eval today, likely discharge home this afternoon after stair training   Contact information:   Weekdays 8-5 Merlene Pulling, PA-C [phone] A fter hours and holidays please check [email or web address] for group call information for [facility]  [place]  Bethena Midget 03/02/2021, 9:08 AM

## 2021-03-02 NOTE — Discharge Summary (Signed)
Discharge Summary  Patient ID: Terrence Gross MRN: [id number] DOB/AGE: February 28, 1951 70 y.o.  Admit date: 03/01/2021 Discharge date: 03/02/2021  Admission Diagnoses:  Osteoarthritis of right knee  Discharge Diagnoses:  Principal Problem:   Osteoarthritis of right knee Active Problems:   S/P total knee replacement, right   Past Medical History:  Diagnosis Date   Arthritis    Cancer ([place])    Squamous and basal cell skin cancer   Complication of anesthesia    Coronary artery disease    Dysrhythmia    afib   Hypertension    PONV (postoperative nausea and vomiting)    1997   Primary localized osteoarthritis of left knee 01/15/2018   Sleep apnea     Surgeries: Procedure(s): TOTAL KNEE ARTHROPLASTY on 03/01/2021   Consultants (if any):   Discharged Condition: Improved  Hospital Course: Terrence Gross is an 70 y.o. male who was admitted 03/01/2021 with a diagnosis of Osteoarthritis of right knee and went to the operating room on 03/01/2021 and underwent the above named procedures.    He was given perioperative antibiotics:  Anti-infectives (From admission, onward)    Start     Dose/Rate Route Frequency Ordered Stop   03/01/21 1800  ceFAZolin (ANCEF) IVPB 2g/100 mL premix        2 g 200 mL/hr over 30 Minutes Intravenous Every 6 hours 03/01/21 1711 03/02/21 0000   03/01/21 0745  ceFAZolin (ANCEF) IVPB 2g/100 mL premix        2 g 200 mL/hr over 30 Minutes Intravenous On call to O.R. 03/01/21 5784 03/01/21 1030     .  He was given sequential compression devices, early ambulation, and he was restarted on his hom Eliquis for DVT prophylaxis.  He benefited maximally from the hospital stay and there were no complications.    Recent vital signs:  Vitals:   03/02/21 0631 03/02/21 1000  BP: 120/64 125/67  Pulse: 74 79  Resp: 18 18  Temp: 98 F (36.7 C) 98 F (36.7 C)  SpO2: 95% 99%    Recent laboratory studies:  Lab Results  Component Value Date   HGB 12.7  (L) 03/02/2021   HGB 15.0 02/16/2021   HGB 12.8 (L) 01/16/2018   Lab Results  Component Value Date   WBC 13.9 (H) 03/02/2021   PLT 180 03/02/2021   No results found for: INR Lab Results  Component Value Date   NA 139 03/02/2021   K 4.4 03/02/2021   CL 107 03/02/2021   CO2 25 03/02/2021   BUN 20 03/02/2021   CREATININE 0.78 03/02/2021   GLUCOSE 142 (H) 03/02/2021    Discharge Medications:   Allergies as of 03/02/2021       Reactions   Tape Rash, Other (See Comments)   BLISTERS [PAPER TAPE OK TO USE]   Other Rash   SILK TAPE        Medication List     STOP taking these medications    aspirin EC 325 MG tablet       TAKE these medications    apixaban 5 MG Tabs tablet Commonly known as: ELIQUIS Take 5 mg by mouth 2 (two) times daily.   bisoprolol-hydrochlorothiazide 10-6.25 MG tablet Commonly known as: ZIAC Take 1 tablet by mouth daily.   ezetimibe 10 MG tablet Commonly known as: ZETIA Take 10 mg by mouth daily.   ondansetron 4 MG tablet Commonly known as: Zofran Take 1 tablet (4 mg total) by mouth every 8 (eight) hours  as needed for nausea or vomiting.   oxyCODONE 5 MG immediate release tablet Commonly known as: Roxicodone Take 1 tablet (5 mg total) by mouth every 4 (four) hours as needed for severe pain.   Ozempic (1 MG/DOSE) 2 MG/1.5ML Sopn Generic drug: Semaglutide (1 MG/DOSE) Inject 1 mg into the skin every Thursday.   rosuvastatin 20 MG tablet Commonly known as: CRESTOR Take 10 mg by mouth daily.   sennosides-docusate sodium 8.6-50 MG tablet Commonly known as: SENOKOT-S Take 2 tablets by mouth daily.        Diagnostic Studies: DG Knee Right Port  Result Date: 03/01/2021 CLINICAL DATA:  Right knee replaced EXAM: PORTABLE RIGHT KNEE - 1-2 VIEW COMPARISON:  Radiograph 01/16/2020 FINDINGS: There is a new 3 component total knee arthroplasty without evidence of loosening or periprosthetic fracture. Expected soft tissue changes. Normal  alignment. IMPRESSION: Right total knee arthroplasty without evidence of immediate hardware complication. Electronically Signed   By: Maurine Simmering M.D.   On: 03/01/2021 15:19    Disposition: Discharge disposition: 01-Home or Self Care          Follow-up Information     Marchia Bond, MD. Schedule an appointment as soon as possible for a visit in 2 week(s).   Specialty: Orthopedic Surgery Contact information: [address] [place] [address] [phone]                  Signed: Jola Baptist 03/02/2021, 2:05 PM

## 2021-03-02 NOTE — TOC Transition Note (Signed)
Transition of Care [facility]) - CM/SW Discharge Note  Patient Details  Name: Terrence Gross MRN: [id number] Date of Birth: 07-02-1950  Transition of Care [facility]) CM/SW Contact:  Sherie Don, LCSW Phone Number: 03/02/2021, 10:09 AM  Clinical Narrative: Patient is expected to discharge home after working with PT. CSW met with patient to confirm discharge plan. Patient will go to OPPT at [facility] in [place]. [place]. Patient has a rolling walker, elevated toilet seat, and crutches at home so there are no DME needs at this time. TOC signing off.  Final next level of care: OP Rehab Barriers to Discharge: No Barriers Identified  Patient Goals and CMS Choice Patient states their goals for this hospitalization and ongoing recovery are:: Discharge home with OPPT Choice offered to / list presented to : NA  Discharge Plan and Services         DME Arranged: N/A DME Agency: NA  Readmission Risk Interventions No flowsheet data found.

## 2021-03-02 NOTE — Progress Notes (Signed)
Patient and wife educated on AVS including discharge instructions, medications-where to pick up and next administration times, and follow up appointments.  All questions were answered and PIV was removed.  Patient stated he would wait for his lunch and then would be ready to leave.  Informed to just let us know when he is ready.

## 2021-03-02 NOTE — Evaluation (Signed)
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  Clinical Impression  Patient does  lag with SLR. Patient ambulated   x 200', negotiated steps. Patient has achieved goals.      Recommendations for follow up therapy are one component of a multi-disciplinary discharge planning process, led by the attending physician.  Recommendations may be updated based on patient status, additional functional criteria and insurance authorization.  Follow Up Recommendations Follow surgeon's recommendation for DC plan and follow-up therapies    Equipment Recommendations  None recommended by PT    Recommendations for Other Services       Precautions / Restrictions Precautions Precautions: Fall;Knee      Mobility  Bed Mobility Overal bed mobility: Modified Independent                  Transfers Overall transfer level: Needs assistance Equipment used: Rolling walker (2 wheeled) Transfers: Sit to/from Stand Sit to Stand: Min guard         General transfer comment: cues for hand place  Ambulation/Gait Ambulation/Gait assistance: Min guard Gait Distance (Feet): 200 Feet Assistive device: Rolling walker (2 wheeled) Gait Pattern/deviations: Step-to pattern;Antalgic     General Gait Details: noted  right knee hyperextends x 4 during stance  Stairs Stairs: Yes Stairs assistance: Min guard Stair Management: One rail Left;Forwards;With cane;With crutches Number of Stairs: 4 General stair comments: patient negotiated safely  Wheelchair Mobility    Modified Rankin (Stroke Patients Only)       Balance Overall balance assessment: Needs assistance Sitting-balance support: Feet supported;No upper extremity supported Sitting balance-Leahy Scale: Normal     Standing balance support: During functional activity;No upper extremity  supported Standing balance-Leahy Scale: Fair                               Pertinent Vitals/Pain Pain Assessment: 0-10 Pain Score: 4  Pain Descriptors / Indicators: Aching Pain Intervention(s): Monitored during session;Premedicated before session;Ice applied    Home Living Family/patient expects to be discharged to:: Private residence Living Arrangements: Spouse/significant other Available Help at Discharge: Family;Available 24 hours/day Type of Home: House Home Access: Stairs to enter Entrance Stairs-Rails: Left Entrance Stairs-Number of Steps: 2 Home Layout: One level Home Equipment: Walker - 2 wheels;Crutches;Cane - single point      Prior Function Level of Independence: Independent               Hand Dominance        Extremity/Trunk Assessment   Upper Extremity Assessment Upper Extremity Assessment: Overall WFL for tasks assessed    Lower Extremity Assessment Lower Extremity Assessment: RLE deficits/detail RLE Deficits / Details: Knee flexion 70, seated, cannot perform SLR       Communication   Communication: No difficulties  Cognition Arousal/Alertness: Awake/alert Behavior During Therapy: WFL for tasks assessed/performed Overall Cognitive Status: Within Functional Limits for tasks assessed                                        General Comments      Exercises Total Joint Exercises Ankle Circles/Pumps: AROM;Both;10 reps Quad Sets: AROM;Both;10 reps Heel Slides: AAROM;Right;10 reps Straight Leg Raises: AAROM;Right;10 reps Long Arc Quad: AROM;Right;5 reps;Seated Knee Flexion: AROM;Right;5 reps;Seated  Assessment/Plan    PT Assessment Patient needs continued PT services  PT Problem List Decreased strength;Decreased mobility;Decreased range of motion;Decreased knowledge of precautions;Decreased activity tolerance;Pain       PT Treatment Interventions DME instruction;Therapeutic activities;Gait  training;Therapeutic exercise;Patient/family education;Stair training;Functional mobility training    PT Goals (Current goals can be found in the Care Plan section)  Acute Rehab PT Goals Patient Stated Goal: go home PT Goal Formulation: Patient unable to participate in goal setting Time For Goal Achievement: 03/09/21 Potential to Achieve Goals: Good    Frequency 7X/week   Barriers to discharge        Co-evaluation               AM-PAC PT "6 Clicks" Mobility  Outcome Measure Help needed turning from your back to your side while in a flat bed without using bedrails?: None Help needed moving from lying on your back to sitting on the side of a flat bed without using bedrails?: None Help needed moving to and from a bed to a chair (including a wheelchair)?: A Little Help needed standing up from a chair using your arms (e.g., wheelchair or bedside chair)?: A Little Help needed to walk in hospital room?: A Little Help needed climbing 3-5 steps with a railing? : A Little 6 Click Score: 20    End of Session Equipment Utilized During Treatment: Gait belt Activity Tolerance: Patient tolerated treatment well Patient left: in bed;with call bell/phone within reach;with family/visitor present Nurse Communication: Mobility status PT Visit Diagnosis: Unsteadiness on feet (R26.81);Difficulty in walking, not elsewhere classified (R26.2);Pain Pain - Right/Left: Right Pain - part of body: Knee    Time: 1045-1130 PT Time Calculation (min) (ACUTE ONLY): 45 min   Charges:   PT Evaluation $PT Eval Low Complexity: 1 Low PT Treatments $Gait Training: 8-22 mins $Therapeutic Exercise: 8-22 mins        Tresa Endo PT Acute Rehabilitation Services Pager [phone] Office [phone]   Claretha Cooper 03/02/2021, 1:06 PM

## 2021-03-02 NOTE — Plan of Care (Signed)

## 2021-03-07 ENCOUNTER — Encounter (HOSPITAL_COMMUNITY): Payer: Self-pay | Admitting: Orthopedic Surgery
